# Patient Record
Sex: Male | Born: 1965 | Hispanic: No | Smoking: Former smoker
Health system: Southern US, Community
[De-identification: ages and names within clinical notes are randomized; demographics above are authoritative.]

## PROBLEM LIST (undated history)

## (undated) DIAGNOSIS — M1990 Unspecified osteoarthritis, unspecified site: Secondary | ICD-10-CM

## (undated) DIAGNOSIS — T4145XA Adverse effect of unspecified anesthetic, initial encounter: Secondary | ICD-10-CM

## (undated) DIAGNOSIS — G4730 Sleep apnea, unspecified: Secondary | ICD-10-CM

## (undated) DIAGNOSIS — T8859XA Other complications of anesthesia, initial encounter: Secondary | ICD-10-CM

## (undated) DIAGNOSIS — K219 Gastro-esophageal reflux disease without esophagitis: Secondary | ICD-10-CM

## (undated) DIAGNOSIS — G709 Myoneural disorder, unspecified: Secondary | ICD-10-CM

## (undated) DIAGNOSIS — I10 Essential (primary) hypertension: Secondary | ICD-10-CM

## (undated) DIAGNOSIS — E7800 Pure hypercholesterolemia, unspecified: Secondary | ICD-10-CM

## (undated) DIAGNOSIS — G8929 Other chronic pain: Secondary | ICD-10-CM

## (undated) DIAGNOSIS — I499 Cardiac arrhythmia, unspecified: Secondary | ICD-10-CM

## (undated) HISTORY — DX: Cardiac arrhythmia, unspecified: I49.9

## (undated) HISTORY — DX: Sleep apnea, unspecified: G47.30

## (undated) HISTORY — PX: JOINT REPLACEMENT: SHX530

## (undated) HISTORY — DX: Other chronic pain: G89.29

---

## 2006-11-25 ENCOUNTER — Emergency Department (HOSPITAL_COMMUNITY): Admission: EM | Admit: 2006-11-25 | Discharge: 2006-11-25 | Payer: Self-pay | Admitting: Emergency Medicine

## 2007-11-09 ENCOUNTER — Emergency Department (HOSPITAL_COMMUNITY): Admission: EM | Admit: 2007-11-09 | Discharge: 2007-11-09 | Payer: Self-pay | Admitting: Emergency Medicine

## 2007-12-15 ENCOUNTER — Emergency Department (HOSPITAL_COMMUNITY): Admission: EM | Admit: 2007-12-15 | Discharge: 2007-12-15 | Payer: Self-pay | Admitting: Emergency Medicine

## 2009-04-17 ENCOUNTER — Emergency Department (HOSPITAL_COMMUNITY): Admission: EM | Admit: 2009-04-17 | Discharge: 2009-04-17 | Payer: Self-pay | Admitting: Emergency Medicine

## 2009-08-29 ENCOUNTER — Emergency Department (HOSPITAL_COMMUNITY): Admission: EM | Admit: 2009-08-29 | Discharge: 2009-08-29 | Payer: Self-pay | Admitting: Emergency Medicine

## 2011-02-13 LAB — RAPID STREP SCREEN (MED CTR MEBANE ONLY): Streptococcus, Group A Screen (Direct): POSITIVE — AB

## 2013-03-01 DIAGNOSIS — Z993 Dependence on wheelchair: Secondary | ICD-10-CM | POA: Insufficient documentation

## 2013-04-20 DIAGNOSIS — Z96649 Presence of unspecified artificial hip joint: Secondary | ICD-10-CM | POA: Insufficient documentation

## 2013-05-11 DIAGNOSIS — M7060 Trochanteric bursitis, unspecified hip: Secondary | ICD-10-CM | POA: Insufficient documentation

## 2013-07-13 DIAGNOSIS — M1612 Unilateral primary osteoarthritis, left hip: Secondary | ICD-10-CM | POA: Insufficient documentation

## 2014-04-19 DIAGNOSIS — R9439 Abnormal result of other cardiovascular function study: Secondary | ICD-10-CM | POA: Insufficient documentation

## 2014-05-19 HISTORY — PX: TOTAL HIP ARTHROPLASTY: SHX124

## 2014-05-19 HISTORY — PX: APPENDECTOMY: SHX54

## 2014-05-20 DIAGNOSIS — Z9049 Acquired absence of other specified parts of digestive tract: Secondary | ICD-10-CM | POA: Insufficient documentation

## 2014-05-21 DIAGNOSIS — I10 Essential (primary) hypertension: Secondary | ICD-10-CM | POA: Insufficient documentation

## 2016-03-11 ENCOUNTER — Emergency Department (HOSPITAL_COMMUNITY)
Admission: EM | Admit: 2016-03-11 | Discharge: 2016-03-11 | Disposition: A | Discharge status: Home / Self Care | Payer: Medicaid Other | Attending: Emergency Medicine | Admitting: Emergency Medicine

## 2016-03-11 ENCOUNTER — Encounter (HOSPITAL_COMMUNITY): Payer: Self-pay

## 2016-03-11 DIAGNOSIS — S39012A Strain of muscle, fascia and tendon of lower back, initial encounter: Secondary | ICD-10-CM | POA: Insufficient documentation

## 2016-03-11 DIAGNOSIS — Y939 Activity, unspecified: Secondary | ICD-10-CM | POA: Diagnosis not present

## 2016-03-11 DIAGNOSIS — Y92410 Unspecified street and highway as the place of occurrence of the external cause: Secondary | ICD-10-CM | POA: Insufficient documentation

## 2016-03-11 DIAGNOSIS — M62838 Other muscle spasm: Secondary | ICD-10-CM | POA: Insufficient documentation

## 2016-03-11 DIAGNOSIS — F17210 Nicotine dependence, cigarettes, uncomplicated: Secondary | ICD-10-CM | POA: Insufficient documentation

## 2016-03-11 DIAGNOSIS — Y999 Unspecified external cause status: Secondary | ICD-10-CM | POA: Insufficient documentation

## 2016-03-11 DIAGNOSIS — S3992XA Unspecified injury of lower back, initial encounter: Secondary | ICD-10-CM | POA: Diagnosis present
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
## 2016-03-11 NOTE — ED Notes (Signed)
**Note De-identified [place] Obfuscation** Bed: WA12 Expected date:  Expected time:  Means of arrival:  Comments: EMS-N/V 

## 2016-09-11 ENCOUNTER — Encounter (HOSPITAL_COMMUNITY): Payer: Self-pay

## 2016-09-11 ENCOUNTER — Emergency Department (HOSPITAL_COMMUNITY)
Admission: EM | Admit: 2016-09-11 | Discharge: 2016-09-11 | Disposition: A | Discharge status: Home / Self Care | Payer: Medicaid Other | Attending: Emergency Medicine | Admitting: Emergency Medicine

## 2016-09-11 DIAGNOSIS — Y929 Unspecified place or not applicable: Secondary | ICD-10-CM | POA: Diagnosis not present

## 2016-09-11 DIAGNOSIS — Y999 Unspecified external cause status: Secondary | ICD-10-CM | POA: Diagnosis not present

## 2016-09-11 DIAGNOSIS — Y939 Activity, unspecified: Secondary | ICD-10-CM | POA: Insufficient documentation

## 2016-09-11 DIAGNOSIS — Z96643 Presence of artificial hip joint, bilateral: Secondary | ICD-10-CM | POA: Diagnosis not present

## 2016-09-11 DIAGNOSIS — I10 Essential (primary) hypertension: Secondary | ICD-10-CM | POA: Insufficient documentation

## 2016-09-11 DIAGNOSIS — R202 Paresthesia of skin: Secondary | ICD-10-CM

## 2016-09-11 DIAGNOSIS — S161XXA Strain of muscle, fascia and tendon at neck level, initial encounter: Secondary | ICD-10-CM

## 2016-09-11 DIAGNOSIS — F17210 Nicotine dependence, cigarettes, uncomplicated: Secondary | ICD-10-CM | POA: Diagnosis not present

## 2016-09-11 DIAGNOSIS — X58XXXA Exposure to other specified factors, initial encounter: Secondary | ICD-10-CM | POA: Insufficient documentation

## 2016-09-11 DIAGNOSIS — S199XXA Unspecified injury of neck, initial encounter: Secondary | ICD-10-CM | POA: Diagnosis present

## 2016-09-11 HISTORY — DX: Essential (primary) hypertension: I10

## 2016-09-11 MED ORDER — NAPROXEN 500 MG PO TABS: 500.0000 mg | ORAL_TABLET | Freq: Two times a day (BID) | ORAL | 0 refills | Status: AC

## 2016-09-11 NOTE — ED Triage Notes (Signed)
**Note De-Identified Mulroy Obfuscation** Patient complains of 3 weeks of neck pain with any ROM. States that the pain is now radiating down back and having some bilateral hand numbness. Denies trauma. Alert and oriented, NAD

## 2016-09-11 NOTE — ED Provider Notes (Signed)
**Note De-Identified Jerrett Obfuscation** [facility]-EMERGENCY DEPT Provider Note   CSN: [id number] Arrival date & time: 09/11/16  1235   By signing my name below, I, Avnee Patel, attest that this documentation has been prepared under the direction and in the presence of Lyndal Pulley, MD  Electronically Signed: Clovis Pu, ED Scribe. 09/11/16. 1:12 PM.   History   Chief Complaint Chief Complaint  Patient presents with  . Neck Pain    HPI Comments:  Anjelo Pyka is a 51 y.o. male who presents to the Emergency Department complaining of recurrent, intermittent neck pain x 3 weeks. Pt reports new paraesthesias to his bilateral upper extremities. He has tried icy/hot patches with no significant relief. No other modifying factors noted. Pt denies weakness or any other associated symptoms. No other complaints noted at this time.   The history is provided by the patient. No language interpreter was used.    Past Medical History:  Diagnosis Date  . Hypertension     There are no active problems to display for this patient.   Past Surgical History:  Procedure Laterality Date  . JOINT REPLACEMENT    . TOTAL HIP ARTHROPLASTY Bilateral     Home Medications    Prior to Admission medications   Not on File    Family History No family history on file.  Social History Social History  Substance Use Topics  . Smoking status: Current Every Day Smoker    Packs/day: 0.50    Types: Cigarettes  . Smokeless tobacco: Never Used  . Alcohol use No     Allergies   Patient has no known allergies.   Review of Systems Review of Systems  Musculoskeletal: Positive for neck pain.  Neurological: Negative for weakness.  All other systems reviewed and are negative.    Physical Exam Updated Vital Signs BP (!) 190/90   Pulse 90   Temp 97.6 F (36.4 C) (Oral)   Resp 18   SpO2 97%   Physical Exam  Constitutional: He is oriented to person, place, and time. He appears well-developed and well-nourished. No distress.  HENT:    Head: Normocephalic and atraumatic.  Nose: Nose normal.  Eyes: Conjunctivae are normal.  Neck: Neck supple. No tracheal deviation present.  Cardiovascular: Normal rate and regular rhythm.   Pulmonary/Chest: Effort normal. No respiratory distress.  Abdominal: Soft. He exhibits no distension.  Musculoskeletal:  b/l cervical paraspinal tenderness without midline tenderness  Neurological: He is alert and oriented to person, place, and time.  Full strength, sensation and movement of bilateral upper extremities.   Skin: Skin is warm and dry.  Psychiatric: He has a normal mood and affect.     ED Treatments / Results  DIAGNOSTIC STUDIES:  Oxygen Saturation is 97% on RA, normal by my interpretation.    COORDINATION OF CARE:  1:11 PM Discussed treatment plan with pt at bedside and pt agreed to plan.  Labs (all labs ordered are listed, but only abnormal results are displayed) Labs Reviewed - No data to display  EKG  EKG Interpretation None       Radiology No results found.  Procedures Procedures (including critical care time)  Medications Ordered in ED Medications - No data to display   Initial Impression / Assessment and Plan / ED Course  I have reviewed the triage vital signs and the nursing notes.  Pertinent labs & imaging results that were available during my care of the patient were reviewed by me and considered in my medical decision making (see chart for **Note De-Identified Seubert Obfuscation** details).     51 year old male presents with bilateral neck pain that is been progressing over weeks. He has noticed some tingling in his hands. He has no motor deficits or signs of discopathy currently. I suspect that he has some bilateral neck muscle spasm that has produced his symptoms. He was offered a local trigger point injection and declined. Patient was recommended to take short course of scheduled NSAIDs and engage in early mobility as definitive treatment. Plan to follow up with PCP as needed and return  precautions discussed for worsening or new concerning symptoms.   Final Clinical Impressions(s) / ED Diagnoses   Final diagnoses:  Strain of neck muscle, initial encounter  Paresthesia of both hands    New Prescriptions New Prescriptions   NAPROXEN (NAPROSYN) 500 MG TABLET    Take 1 tablet (500 mg total) by mouth 2 (two) times daily with a meal.   I personally performed the services described in this documentation, which was scribed in my presence. The recorded information has been reviewed and is accurate.       Lyndal Pulley, MD 09/11/16 1324

## 2016-09-11 NOTE — ED Notes (Signed)
**Note De-Identified Ellender Obfuscation** Pt states that he has not taken BP med this am -- will take when he is d/c'd.  Pt c/o neck pain that radiates from base of skull down back. Has been getting worse for 3 weeks-- has tried Ice, heat, and oTC meds-- no relief.

## 2016-09-17 ENCOUNTER — Emergency Department (HOSPITAL_COMMUNITY)
Admission: EM | Admit: 2016-09-17 | Discharge: 2016-09-17 | Disposition: A | Discharge status: Home / Self Care | Payer: Medicaid Other | Attending: Emergency Medicine | Admitting: Emergency Medicine

## 2016-09-17 ENCOUNTER — Encounter (HOSPITAL_COMMUNITY): Payer: Self-pay

## 2016-09-17 ENCOUNTER — Emergency Department (HOSPITAL_COMMUNITY): Payer: Medicaid Other

## 2016-09-17 DIAGNOSIS — Z96643 Presence of artificial hip joint, bilateral: Secondary | ICD-10-CM | POA: Diagnosis not present

## 2016-09-17 DIAGNOSIS — Z79899 Other long term (current) drug therapy: Secondary | ICD-10-CM | POA: Insufficient documentation

## 2016-09-17 DIAGNOSIS — Y999 Unspecified external cause status: Secondary | ICD-10-CM | POA: Diagnosis not present

## 2016-09-17 DIAGNOSIS — Z7982 Long term (current) use of aspirin: Secondary | ICD-10-CM | POA: Insufficient documentation

## 2016-09-17 DIAGNOSIS — F17210 Nicotine dependence, cigarettes, uncomplicated: Secondary | ICD-10-CM | POA: Insufficient documentation

## 2016-09-17 DIAGNOSIS — Y939 Activity, unspecified: Secondary | ICD-10-CM | POA: Diagnosis not present

## 2016-09-17 DIAGNOSIS — Y929 Unspecified place or not applicable: Secondary | ICD-10-CM | POA: Diagnosis not present

## 2016-09-17 DIAGNOSIS — W1830XA Fall on same level, unspecified, initial encounter: Secondary | ICD-10-CM | POA: Diagnosis not present

## 2016-09-17 DIAGNOSIS — M4802 Spinal stenosis, cervical region: Secondary | ICD-10-CM | POA: Diagnosis not present

## 2016-09-17 DIAGNOSIS — R200 Anesthesia of skin: Secondary | ICD-10-CM | POA: Diagnosis present

## 2016-09-17 DIAGNOSIS — I10 Essential (primary) hypertension: Secondary | ICD-10-CM | POA: Insufficient documentation

## 2016-09-17 HISTORY — DX: Pure hypercholesterolemia, unspecified: E78.00

## 2016-09-17 LAB — CBC WITH DIFFERENTIAL/PLATELET
BASOS ABS: 0.1 10*3/uL (ref 0.0–0.1)
BASOS PCT: 1 %
EOS ABS: 0.2 10*3/uL (ref 0.0–0.7)
EOS PCT: 2 %
HEMATOCRIT: 42.8 % (ref 39.0–52.0)
Hemoglobin: 14.4 g/dL (ref 13.0–17.0)
Lymphocytes Relative: 38 %
Lymphs Abs: 3.4 10*3/uL (ref 0.7–4.0)
MCH: 29 pg (ref 26.0–34.0)
MCHC: 33.6 g/dL (ref 30.0–36.0)
MCV: 86.1 fL (ref 78.0–100.0)
MONO ABS: 0.7 10*3/uL (ref 0.1–1.0)
MONOS PCT: 8 %
Neutro Abs: 4.5 10*3/uL (ref 1.7–7.7)
Neutrophils Relative %: 51 %
PLATELETS: 204 10*3/uL (ref 150–400)
RBC: 4.97 MIL/uL (ref 4.22–5.81)
RDW: 14.4 % (ref 11.5–15.5)
WBC: 8.8 10*3/uL (ref 4.0–10.5)

## 2016-09-17 LAB — URINALYSIS, ROUTINE W REFLEX MICROSCOPIC
BILIRUBIN URINE: NEGATIVE
Glucose, UA: NEGATIVE mg/dL
Hgb urine dipstick: NEGATIVE
KETONES UR: NEGATIVE mg/dL
LEUKOCYTES UA: NEGATIVE
NITRITE: NEGATIVE
PH: 5 (ref 5.0–8.0)
PROTEIN: NEGATIVE mg/dL
Specific Gravity, Urine: 1.021 (ref 1.005–1.030)

## 2016-09-17 LAB — COMPREHENSIVE METABOLIC PANEL
ALBUMIN: 4.4 g/dL (ref 3.5–5.0)
ALK PHOS: 87 U/L (ref 38–126)
ALT: 37 U/L (ref 17–63)
ANION GAP: 8 (ref 5–15)
AST: 30 U/L (ref 15–41)
BILIRUBIN TOTAL: 0.4 mg/dL (ref 0.3–1.2)
BUN: 11 mg/dL (ref 6–20)
CALCIUM: 9 mg/dL (ref 8.9–10.3)
CO2: 24 mmol/L (ref 22–32)
CREATININE: 0.85 mg/dL (ref 0.61–1.24)
Chloride: 107 mmol/L (ref 101–111)
GFR calc Af Amer: >60 mL/min (ref >60)
GFR calc non Af Amer: >60 mL/min (ref >60)
GLUCOSE: 117 mg/dL — AB (ref 65–99)
Potassium: 3.7 mmol/L (ref 3.5–5.1)
SODIUM: 139 mmol/L (ref 135–145)
TOTAL PROTEIN: 7.8 g/dL (ref 6.5–8.1)

## 2016-09-17 LAB — MAGNESIUM: Magnesium: 2.1 mg/dL (ref 1.7–2.4)

## 2016-09-17 MED ORDER — METHYLPREDNISOLONE 4 MG PO TBPK: ORAL_TABLET | ORAL | 0 refills | Status: DC

## 2016-09-17 NOTE — ED Provider Notes (Signed)
**Note De-Identified Reap Obfuscation** [facility] Provider Note   CSN: [id number] Arrival date & time: 09/17/16  1025     History   Chief Complaint Chief Complaint  Patient presents with  . Fall  . Numbness    HPI Hunter Hernandez is a 51 y.o. male.  The history is provided by the patient. No language interpreter was used.  Fall    Hunter Hernandez is a 51 y.o. male who presents to the Emergency Department complaining of numbness.  He presents for evaluation of progressive numbness. Over the last 3-4 weeks he has had progressive numbness and tingling sensation in bilateral arms and legs involving his trunk as well. He initially had associated neck pain and was seen in the emergency department and he was started on naproxen. Since taking the naproxen his neck pain has resolved but he has ongoing paresthesias throughout his body. Only his head is spared from these paresthesias. He denies any chest pain, shortness of breath, nausea, vomiting, abdominal pain, fever, weakness. He is falling more frequently secondary to feeling numb. Past Medical History:  Diagnosis Date  . High cholesterol   . Hypertension     There are no active problems to display for this patient.   Past Surgical History:  Procedure Laterality Date  . APPENDECTOMY    . JOINT REPLACEMENT    . TOTAL HIP ARTHROPLASTY Bilateral        Home Medications    Prior to Admission medications   Medication Sig Start Date End Date Taking? Authorizing Provider  amLODipine (NORVASC) 10 MG tablet Take 10 mg by mouth daily.   Yes Historical Provider, MD  aspirin-acetaminophen-caffeine (EXCEDRIN MIGRAINE) [phone] MG tablet Take 2 tablets by mouth every 6 (six) hours as needed (for pain).   Yes Historical Provider, MD  hydrochlorothiazide (HYDRODIURIL) 25 MG tablet Take 25 mg by mouth daily.   Yes Historical Provider, MD  metoprolol succinate (TOPROL-XL) 25 MG 24 hr tablet Take 25 mg by mouth daily.   Yes Historical Provider, MD  naproxen (NAPROSYN) 500 MG  tablet Take 1 tablet (500 mg total) by mouth 2 (two) times daily with a meal. 09/11/16 09/18/16 Yes Lyndal Pulley, MD  pravastatin (PRAVACHOL) 40 MG tablet Take 40 mg by mouth at bedtime.   Yes Historical Provider, MD  methylPREDNISolone (MEDROL DOSEPAK) 4 MG TBPK tablet Take according to package instructions 09/17/16   Tilden Fossa, MD    Family History Family History  Problem Relation Age of Onset  . Cirrhosis Mother   . Cirrhosis Father     Social History Social History  Substance Use Topics  . Smoking status: Current Every Day Smoker    Packs/day: 0.50    Types: Cigarettes  . Smokeless tobacco: Never Used  . Alcohol use No     Allergies   Patient has no known allergies.   Review of Systems Review of Systems  All other systems reviewed and are negative.    Physical Exam Updated Vital Signs BP (!) 176/103 (BP Location: Left Arm)   Pulse 71   Temp 98.1 F (36.7 C) (Oral)   Resp 14   Ht  (1.651 m)   Wt 200 lb (90.7 kg)   SpO2 95%   BMI 33.28 kg/m   Physical Exam  Constitutional: He is oriented to person, place, and time. He appears well-developed and well-nourished.  HENT:  Head: Normocephalic and atraumatic.  Cardiovascular: Normal rate and regular rhythm.   No murmur heard. Pulmonary/Chest: Effort normal and breath sounds normal. No **Note De-Identified Golaszewski Obfuscation** respiratory distress.  Abdominal: Soft. There is no tenderness. There is no rebound and no guarding.  Musculoskeletal: He exhibits no edema or tenderness.  Neurological: He is alert and oriented to person, place, and time.  5 out of 5 strength in all 4 extremities. Altered sensation to light touch throughout bilateral upper, bilateral lower extremities. Decreased sensation to the torso as well as the saddle region. Downgoing toes bilaterally. No ankle clonus. 2+ patellar reflexes bilaterally.  Skin: Skin is warm and dry.  Psychiatric: He has a normal mood and affect. His behavior is normal.  Nursing note and vitals  reviewed.    ED Treatments / Results  Labs (all labs ordered are listed, but only abnormal results are displayed) Labs Reviewed  COMPREHENSIVE METABOLIC PANEL - Abnormal; Notable for the following:       Result Value   Glucose, Bld 117 (*)    All other components within normal limits  CBC WITH DIFFERENTIAL/PLATELET  URINALYSIS, ROUTINE W REFLEX MICROSCOPIC  MAGNESIUM    EKG  EKG Interpretation None       Radiology Mr Cervical Spine Wo Contrast  Result Date: 09/17/2016 CLINICAL DATA:  Patient reports that he had pain of the neck 3 weeks ago and was given Naproxen. Patient states he is now having tingling of bilateral arms and numbness and tingling of both legs. Patient c/o difficulty walking due to the numbness. EXAM: MRI CERVICAL SPINE WITHOUT CONTRAST TECHNIQUE: Multiplanar, multisequence MR imaging of the cervical spine was performed. No intravenous contrast was administered. COMPARISON:  None. FINDINGS: Alignment: Physiologic. Vertebrae: No fracture, evidence of discitis, or bone lesion. Cord: Mild increased T2 signal within the cervical spinal cord at the level C4-5 likely reflecting mild cord edema versus myelomalacia. Posterior Fossa, vertebral arteries, paraspinal tissues: Negative. Disc levels: Discs: Degenerative disc disease with disc height loss at C3-4, C4-5 and C5-6. C2-3: No significant disc bulge. No neural foraminal stenosis. No central canal stenosis. C3-4: Broad-based disc bulge with left uncovertebral degenerative changes. Mild left foraminal stenosis. No right foraminal stenosis. No central canal stenosis. C4-5: Broad-based disc bulge with a left paracentral disc protrusion. Severe spinal stenosis and compression of the cervical spinal cord. Left uncovertebral degenerative changes with severe left foraminal stenosis. Mild right foraminal stenosis. C5-6: Broad-based disc bulge with a broad central disc protrusion contacting the ventral cervical spinal cord. Moderate  spinal stenosis. Left uncovertebral degenerative changes with severe left foraminal stenosis. Moderate right foraminal stenosis. C6-7: Broad-based disc bulge with a small central disc protrusion. Moderate bilateral foraminal stenosis. C7-T1: No significant disc bulge. No neural foraminal stenosis. No central canal stenosis. IMPRESSION: 1. At C4-5 there is a broad-based disc bulge with a left paracentral disc protrusion. Severe spinal stenosis and compression of the cervical spinal cord. Left uncovertebral degenerative changes with severe left foraminal stenosis. Mild right foraminal stenosis. Mild increased T2 signal within the cervical spinal cord at the level C4-5 likely reflecting mild cord edema versus myelomalacia. 2. At C5-6 there is a broad-based disc bulge with a broad central disc protrusion contacting the ventral cervical spinal cord. Moderate spinal stenosis. Left uncovertebral degenerative changes with severe left foraminal stenosis. Moderate right foraminal stenosis. 3. At C3-4 there is a broad-based disc bulge with left uncovertebral degenerative changes. Mild left foraminal stenosis. Electronically Signed   By: Elige Ko   On: 09/17/2016 11:59    Procedures Procedures (including critical care time)  Medications Ordered in ED Medications - No data to display   Initial Impression / Assessment **Note De-Identified Shaheed Obfuscation** and Plan / ED Course  I have reviewed the triage vital signs and the nursing notes.  Pertinent labs & imaging results that were available during my care of the patient were reviewed by me and considered in my medical decision making (see chart for details).     Patient here for 3-4 weeks of progressive tingling in all 4 extremities with frequent falls. He has decreased sensation to light touch from the neck down but otherwise is neurologically intact. MRI demonstrates spinal stenosis. Discussed with Dr. Wynetta Emery with neurosurgery. Will start steroid burst with close neurosurgery follow-up. Discussed  with patient close return precautions for any new neurologic complaints.  Final Clinical Impressions(s) / ED Diagnoses   Final diagnoses:  Spinal stenosis of cervical region    New Prescriptions Discharge Medication List as of 09/17/2016 12:53 PM    START taking these medications   Details  methylPREDNISolone (MEDROL DOSEPAK) 4 MG TBPK tablet Take according to package instructions, Print         Tilden Fossa, MD 09/17/16 [phone]

## 2016-09-17 NOTE — ED Notes (Signed)
**Note De-identified Eatherly Obfuscation** Pt given urinal and made aware of need for urine 

## 2016-09-17 NOTE — ED Triage Notes (Signed)
**Note De-Identified Hunter Hernandez** Patient reports that he had pain of the neck 3 weeks ago and was given Naproxen. Patient states he is now having tingling of bilateral arms and numbness and tingling of both legs. Patient c/o difficulty walking due to the numbness.

## 2016-09-17 NOTE — ED Notes (Signed)
**Note De-Identified Studer Obfuscation** Pt wheeled out with walker to lobby.  Home health aide to pick up patient.  Pt independent at discharge.  Verbalized understanding of discharge instructions.

## 2016-09-17 NOTE — ED Notes (Signed)
**Note De-identified Walko Obfuscation** Patient transported to MRI 

## 2016-09-19 ENCOUNTER — Encounter (HOSPITAL_COMMUNITY)
Admission: EM | Disposition: A | Discharge status: Home / Self Care | Payer: Self-pay | Source: Home / Self Care | Attending: Neurosurgery

## 2016-09-19 ENCOUNTER — Inpatient Hospital Stay (HOSPITAL_COMMUNITY)
Admission: EM | Admit: 2016-09-19 | Discharge: 2016-09-21 | DRG: 472 | Disposition: A | Discharge status: Home / Self Care | Payer: Medicaid Other | Attending: Neurosurgery | Admitting: Neurosurgery

## 2016-09-19 ENCOUNTER — Encounter (HOSPITAL_COMMUNITY): Payer: Self-pay | Admitting: Emergency Medicine

## 2016-09-19 ENCOUNTER — Emergency Department (HOSPITAL_COMMUNITY): Payer: Medicaid Other | Admitting: Certified Registered Nurse Anesthetist

## 2016-09-19 ENCOUNTER — Emergency Department (HOSPITAL_COMMUNITY): Payer: Medicaid Other

## 2016-09-19 DIAGNOSIS — M50021 Cervical disc disorder at C4-C5 level with myelopathy: Secondary | ICD-10-CM | POA: Diagnosis present

## 2016-09-19 DIAGNOSIS — G959 Disease of spinal cord, unspecified: Secondary | ICD-10-CM | POA: Diagnosis present

## 2016-09-19 DIAGNOSIS — F17210 Nicotine dependence, cigarettes, uncomplicated: Secondary | ICD-10-CM | POA: Diagnosis present

## 2016-09-19 DIAGNOSIS — R296 Repeated falls: Secondary | ICD-10-CM | POA: Diagnosis present

## 2016-09-19 DIAGNOSIS — M4712 Other spondylosis with myelopathy, cervical region: Secondary | ICD-10-CM | POA: Diagnosis present

## 2016-09-19 DIAGNOSIS — M4802 Spinal stenosis, cervical region: Secondary | ICD-10-CM | POA: Diagnosis present

## 2016-09-19 DIAGNOSIS — I10 Essential (primary) hypertension: Secondary | ICD-10-CM | POA: Diagnosis present

## 2016-09-19 DIAGNOSIS — G9520 Unspecified cord compression: Secondary | ICD-10-CM | POA: Diagnosis present

## 2016-09-19 DIAGNOSIS — Z96643 Presence of artificial hip joint, bilateral: Secondary | ICD-10-CM | POA: Diagnosis present

## 2016-09-19 DIAGNOSIS — Z419 Encounter for procedure for purposes other than remedying health state, unspecified: Secondary | ICD-10-CM

## 2016-09-19 HISTORY — PX: ANTERIOR CERVICAL DECOMP/DISCECTOMY FUSION: SHX1161

## 2016-09-19 LAB — COMPREHENSIVE METABOLIC PANEL
ALBUMIN: 4.2 g/dL (ref 3.5–5.0)
ALT: 36 U/L (ref 17–63)
AST: 36 U/L (ref 15–41)
Alkaline Phosphatase: 86 U/L (ref 38–126)
Anion gap: 9 (ref 5–15)
BUN: 14 mg/dL (ref 6–20)
CHLORIDE: 106 mmol/L (ref 101–111)
CO2: 25 mmol/L (ref 22–32)
CREATININE: 0.86 mg/dL (ref 0.61–1.24)
Calcium: 9.2 mg/dL (ref 8.9–10.3)
GFR calc Af Amer: >60 mL/min (ref >60)
GLUCOSE: 104 mg/dL — AB (ref 65–99)
POTASSIUM: 3.4 mmol/L — AB (ref 3.5–5.1)
Sodium: 140 mmol/L (ref 135–145)
Total Bilirubin: 1.1 mg/dL (ref 0.3–1.2)
Total Protein: 7.9 g/dL (ref 6.5–8.1)

## 2016-09-19 LAB — CBC WITH DIFFERENTIAL/PLATELET
Basophils Absolute: 0 10*3/uL (ref 0.0–0.1)
Basophils Relative: 0 %
EOS ABS: 0.1 10*3/uL (ref 0.0–0.7)
EOS PCT: 1 %
HCT: 44.8 % (ref 39.0–52.0)
Hemoglobin: 14.5 g/dL (ref 13.0–17.0)
LYMPHS ABS: 4.6 10*3/uL — AB (ref 0.7–4.0)
LYMPHS PCT: 34 %
MCH: 28.5 pg (ref 26.0–34.0)
MCHC: 32.4 g/dL (ref 30.0–36.0)
MCV: 88.2 fL (ref 78.0–100.0)
MONO ABS: 1 10*3/uL (ref 0.1–1.0)
MONOS PCT: 8 %
Neutro Abs: 7.8 10*3/uL — ABNORMAL HIGH (ref 1.7–7.7)
Neutrophils Relative %: 57 %
PLATELETS: 204 10*3/uL (ref 150–400)
RBC: 5.08 MIL/uL (ref 4.22–5.81)
RDW: 14.3 % (ref 11.5–15.5)
WBC: 13.6 10*3/uL — AB (ref 4.0–10.5)

## 2016-09-19 LAB — URINALYSIS, ROUTINE W REFLEX MICROSCOPIC
BILIRUBIN URINE: NEGATIVE
GLUCOSE, UA: NEGATIVE mg/dL
Hgb urine dipstick: NEGATIVE
KETONES UR: NEGATIVE mg/dL
Leukocytes, UA: NEGATIVE
NITRITE: NEGATIVE
PH: 5 (ref 5.0–8.0)
Protein, ur: NEGATIVE mg/dL
SPECIFIC GRAVITY, URINE: 1.017 (ref 1.005–1.030)

## 2016-09-19 LAB — SURGICAL PCR SCREEN
MRSA, PCR: NEGATIVE
STAPHYLOCOCCUS AUREUS: POSITIVE — AB

## 2016-09-19 LAB — PROTIME-INR
INR: 1.04
PROTHROMBIN TIME: 13.6 s (ref 11.4–15.2)

## 2016-09-19 SURGERY — ANTERIOR CERVICAL DECOMPRESSION/DISCECTOMY FUSION 1 LEVEL
Anesthesia: General

## 2016-09-19 MED ORDER — METHYLPREDNISOLONE SODIUM SUCC 125 MG IJ SOLR
125.0000 mg | Freq: Once | INTRAMUSCULAR | Status: AC
  Administered 2016-09-19: 125 mg via INTRAVENOUS
  Filled 2016-09-19: qty 2

## 2016-09-19 MED ORDER — FENTANYL CITRATE (PF) 100 MCG/2ML IJ SOLN
INTRAMUSCULAR | Status: DC | PRN
  Administered 2016-09-19: 100 ug via INTRAVENOUS
  Administered 2016-09-19: 50 ug via INTRAVENOUS
  Administered 2016-09-19: 100 ug via INTRAVENOUS
  Administered 2016-09-19: 50 ug via INTRAVENOUS

## 2016-09-19 MED ORDER — HYDROMORPHONE HCL 1 MG/ML IJ SOLN
1.0000 mg | Freq: Once | INTRAMUSCULAR | Status: AC
  Administered 2016-09-19: 1 mg via INTRAVENOUS
  Filled 2016-09-19: qty 1

## 2016-09-19 MED ORDER — PHENYLEPHRINE HCL 10 MG/ML IJ SOLN
INTRAMUSCULAR | Status: DC | PRN
  Administered 2016-09-19: 10 ug/min via INTRAVENOUS

## 2016-09-19 MED ORDER — METOPROLOL TARTRATE 5 MG/5ML IV SOLN
INTRAVENOUS | Status: DC | PRN
  Administered 2016-09-19 (×2): 1 mg via INTRAVENOUS

## 2016-09-19 MED ORDER — HYDROMORPHONE HCL 1 MG/ML IJ SOLN: 0.2500 mg | INTRAMUSCULAR | Status: DC | PRN

## 2016-09-19 MED ORDER — LIDOCAINE HCL (CARDIAC) 20 MG/ML IV SOLN
INTRAVENOUS | Status: DC | PRN
  Administered 2016-09-19: 80 mg via INTRAVENOUS

## 2016-09-19 MED ORDER — PROMETHAZINE HCL 25 MG/ML IJ SOLN: 6.2500 mg | INTRAMUSCULAR | Status: DC | PRN

## 2016-09-19 MED ORDER — SUGAMMADEX SODIUM 200 MG/2ML IV SOLN
INTRAVENOUS | Status: DC | PRN
  Administered 2016-09-19: 200 mg via INTRAVENOUS

## 2016-09-19 MED ORDER — THROMBIN 5000 UNITS EX SOLR
OROMUCOSAL | Status: DC | PRN
  Administered 2016-09-19: 20:00:00 via TOPICAL

## 2016-09-19 MED ORDER — DEXTROSE 5 % IV SOLN
1000.0000 mg | Freq: Once | INTRAVENOUS | Status: AC
  Administered 2016-09-19: 1000 mg via INTRAVENOUS
  Filled 2016-09-19: qty 10

## 2016-09-19 MED ORDER — METHOCARBAMOL 1000 MG/10ML IJ SOLN: 1000.0000 mg | Freq: Once | INTRAMUSCULAR | Status: DC

## 2016-09-19 MED ORDER — CEFAZOLIN SODIUM-DEXTROSE 2-3 GM-% IV SOLR
INTRAVENOUS | Status: DC | PRN
  Administered 2016-09-19: 2 g via INTRAVENOUS

## 2016-09-19 MED ORDER — MIDAZOLAM HCL 2 MG/2ML IJ SOLN
INTRAMUSCULAR | Status: AC
  Filled 2016-09-19: qty 2

## 2016-09-19 MED ORDER — HYDRALAZINE HCL 20 MG/ML IJ SOLN
INTRAMUSCULAR | Status: AC
  Filled 2016-09-19: qty 1

## 2016-09-19 MED ORDER — ROCURONIUM BROMIDE 100 MG/10ML IV SOLN
INTRAVENOUS | Status: DC | PRN
  Administered 2016-09-19: 50 mg via INTRAVENOUS

## 2016-09-19 MED ORDER — LACTATED RINGERS IV SOLN
INTRAVENOUS | Status: DC
  Administered 2016-09-19 (×3): via INTRAVENOUS

## 2016-09-19 MED ORDER — THROMBIN 5000 UNITS EX SOLR
CUTANEOUS | Status: AC
  Filled 2016-09-19: qty 15000

## 2016-09-19 MED ORDER — 0.9 % SODIUM CHLORIDE (POUR BTL) OPTIME
TOPICAL | Status: DC | PRN
  Administered 2016-09-19: 1000 mL

## 2016-09-19 MED ORDER — METOPROLOL SUCCINATE ER 25 MG PO TB24
25.0000 mg | ORAL_TABLET | Freq: Every day | ORAL | Status: DC
  Administered 2016-09-19 – 2016-09-21 (×3): 25 mg via ORAL
  Filled 2016-09-19 (×3): qty 1

## 2016-09-19 MED ORDER — DEXAMETHASONE SODIUM PHOSPHATE 4 MG/ML IJ SOLN
INTRAMUSCULAR | Status: DC | PRN
  Administered 2016-09-19: 10 mg via INTRAVENOUS

## 2016-09-19 MED ORDER — HEMOSTATIC AGENTS (NO CHARGE) OPTIME
TOPICAL | Status: DC | PRN
  Administered 2016-09-19: 1 via TOPICAL

## 2016-09-19 MED ORDER — SODIUM CHLORIDE 0.9 % IR SOLN
Status: DC | PRN
  Administered 2016-09-19: 20:00:00

## 2016-09-19 MED ORDER — MUPIROCIN 2 % EX OINT
1.0000 "application " | TOPICAL_OINTMENT | Freq: Once | CUTANEOUS | Status: AC
  Administered 2016-09-19: 1 via TOPICAL

## 2016-09-19 MED ORDER — FENTANYL CITRATE (PF) 250 MCG/5ML IJ SOLN
INTRAMUSCULAR | Status: AC
  Filled 2016-09-19: qty 5

## 2016-09-19 MED ORDER — ACETAMINOPHEN 10 MG/ML IV SOLN
INTRAVENOUS | Status: DC | PRN
  Administered 2016-09-19: 1000 mg via INTRAVENOUS

## 2016-09-19 MED ORDER — ACETAMINOPHEN 10 MG/ML IV SOLN
INTRAVENOUS | Status: AC
  Filled 2016-09-19: qty 100

## 2016-09-19 MED ORDER — DEXAMETHASONE SODIUM PHOSPHATE 10 MG/ML IJ SOLN
10.0000 mg | Freq: Four times a day (QID) | INTRAMUSCULAR | Status: DC
Start: 2016-09-20 — End: 2016-09-20
  Administered 2016-09-20 (×3): 10 mg via INTRAVENOUS
  Filled 2016-09-19 (×4): qty 1

## 2016-09-19 MED ORDER — MIDAZOLAM HCL 5 MG/5ML IJ SOLN
INTRAMUSCULAR | Status: DC | PRN
  Administered 2016-09-19: 2 mg via INTRAVENOUS

## 2016-09-19 MED ORDER — MUPIROCIN 2 % EX OINT
TOPICAL_OINTMENT | CUTANEOUS | Status: AC
  Administered 2016-09-19: 1 via TOPICAL
  Filled 2016-09-19: qty 22

## 2016-09-19 MED ORDER — CEFAZOLIN SODIUM-DEXTROSE 2-4 GM/100ML-% IV SOLN
INTRAVENOUS | Status: AC
  Filled 2016-09-19: qty 100

## 2016-09-19 MED ORDER — THROMBIN 5000 UNITS EX SOLR
CUTANEOUS | Status: DC | PRN
  Administered 2016-09-19 (×2): 5000 [IU] via TOPICAL

## 2016-09-19 MED ORDER — HYDRALAZINE HCL 20 MG/ML IJ SOLN
10.0000 mg | Freq: Once | INTRAMUSCULAR | Status: AC
Start: 2016-09-19 — End: 2016-09-19
  Administered 2016-09-19: 10 mg via INTRAVENOUS

## 2016-09-19 MED ORDER — PROPOFOL 10 MG/ML IV BOLUS
INTRAVENOUS | Status: DC | PRN
  Administered 2016-09-19: 160 mg via INTRAVENOUS

## 2016-09-19 SURGICAL SUPPLY — 68 items
ADH SKN CLS APL DERMABOND .7 (GAUZE/BANDAGES/DRESSINGS) ×1
APL SKNCLS STERI-STRIP NONHPOA (GAUZE/BANDAGES/DRESSINGS) ×1
BAG DECANTER FOR FLEXI CONT (MISCELLANEOUS) ×3 IMPLANT
BENZOIN TINCTURE PRP APPL 2/3 (GAUZE/BANDAGES/DRESSINGS) ×3 IMPLANT
BIT DRILL SPINE QC 14 (BIT) ×2 IMPLANT
BUR MATCHSTICK NEURO 3.0 LAGG (BURR) ×3 IMPLANT
CANISTER SUCT 3000ML PPV (MISCELLANEOUS) ×3 IMPLANT
CARTRIDGE OIL MAESTRO DRILL (MISCELLANEOUS) ×1 IMPLANT
CLOSURE WOUND 1/2 X4 (GAUZE/BANDAGES/DRESSINGS) ×1
DERMABOND ADVANCED (GAUZE/BANDAGES/DRESSINGS) ×2
DERMABOND ADVANCED .7 DNX12 (GAUZE/BANDAGES/DRESSINGS) IMPLANT
DIFFUSER DRILL AIR PNEUMATIC (MISCELLANEOUS) ×3 IMPLANT
DRAPE C-ARM 42X72 X-RAY (DRAPES) ×6 IMPLANT
DRAPE LAPAROTOMY 100X72 PEDS (DRAPES) ×3 IMPLANT
DRAPE MICROSCOPE LEICA (MISCELLANEOUS) ×3 IMPLANT
DRAPE POUCH INSTRU U-SHP 10X18 (DRAPES) ×3 IMPLANT
DRSG OPSITE POSTOP 4X6 (GAUZE/BANDAGES/DRESSINGS) ×2 IMPLANT
DURAPREP 6ML APPLICATOR 50/CS (WOUND CARE) ×3 IMPLANT
ELECT COATED BLADE 2.86 ST (ELECTRODE) ×3 IMPLANT
ELECT REM PT RETURN 9FT ADLT (ELECTROSURGICAL) ×3
ELECTRODE REM PT RTRN 9FT ADLT (ELECTROSURGICAL) ×1 IMPLANT
GAUZE SPONGE 4X4 12PLY STRL (GAUZE/BANDAGES/DRESSINGS) ×3 IMPLANT
GAUZE SPONGE 4X4 16PLY XRAY LF (GAUZE/BANDAGES/DRESSINGS) IMPLANT
GLOVE BIO SURGEON STRL SZ7 (GLOVE) IMPLANT
GLOVE BIO SURGEON STRL SZ8 (GLOVE) ×5 IMPLANT
GLOVE BIO SURGEON STRL SZ8.5 (GLOVE) ×2 IMPLANT
GLOVE BIOGEL PI IND STRL 7.0 (GLOVE) IMPLANT
GLOVE BIOGEL PI IND STRL 7.5 (GLOVE) IMPLANT
GLOVE BIOGEL PI INDICATOR 7.0 (GLOVE)
GLOVE BIOGEL PI INDICATOR 7.5 (GLOVE) ×2
GLOVE ECLIPSE 7.5 STRL STRAW (GLOVE) ×6 IMPLANT
GLOVE EXAM NITRILE LRG STRL (GLOVE) IMPLANT
GLOVE EXAM NITRILE XL STR (GLOVE) IMPLANT
GLOVE EXAM NITRILE XS STR PU (GLOVE) IMPLANT
GLOVE INDICATOR 8.5 STRL (GLOVE) ×3 IMPLANT
GOWN STRL REUS W/ TWL LRG LVL3 (GOWN DISPOSABLE) ×1 IMPLANT
GOWN STRL REUS W/ TWL XL LVL3 (GOWN DISPOSABLE) ×1 IMPLANT
GOWN STRL REUS W/TWL 2XL LVL3 (GOWN DISPOSABLE) ×3 IMPLANT
GOWN STRL REUS W/TWL LRG LVL3 (GOWN DISPOSABLE)
GOWN STRL REUS W/TWL XL LVL3 (GOWN DISPOSABLE) ×6
HALTER HD/CHIN CERV TRACTION D (MISCELLANEOUS) ×3 IMPLANT
HEMOSTAT POWDER KIT SURGIFOAM (HEMOSTASIS) ×3 IMPLANT
KIT BASIN OR (CUSTOM PROCEDURE TRAY) ×3 IMPLANT
KIT ROOM TURNOVER OR (KITS) ×3 IMPLANT
NDL HYPO 18GX1.5 BLUNT FILL (NEEDLE) ×1 IMPLANT
NDL SPNL 20GX3.5 QUINCKE YW (NEEDLE) ×1 IMPLANT
NEEDLE HYPO 18GX1.5 BLUNT FILL (NEEDLE) ×3 IMPLANT
NEEDLE SPNL 20GX3.5 QUINCKE YW (NEEDLE) ×3 IMPLANT
NS IRRIG 1000ML POUR BTL (IV SOLUTION) ×3 IMPLANT
OIL CARTRIDGE MAESTRO DRILL (MISCELLANEOUS) ×3
PACK LAMINECTOMY NEURO (CUSTOM PROCEDURE TRAY) ×3 IMPLANT
PAD ARMBOARD 7.5X6 YLW CONV (MISCELLANEOUS) ×9 IMPLANT
PIN DISTRACTION 14MM (PIN) ×4 IMPLANT
PLATE ANT CERV XTEND 1 LV 14 (Plate) ×2 IMPLANT
RUBBERBAND STERILE (MISCELLANEOUS) ×6 IMPLANT
SCREW XTD VAR 4.2 SELF TAP (Screw) ×8 IMPLANT
SPACER CERVICAL FRGE 12X14X6-7 (Spacer) ×2 IMPLANT
SPACER CERVICAL FRGE 12X14X7-7 (Spacer) ×2 IMPLANT
SPONGE INTESTINAL PEANUT (DISPOSABLE) ×3 IMPLANT
SPONGE SURGIFOAM ABS GEL SZ50 (HEMOSTASIS) ×3 IMPLANT
STRIP CLOSURE SKIN 1/2X4 (GAUZE/BANDAGES/DRESSINGS) ×2 IMPLANT
SUT VIC AB 3-0 SH 8-18 (SUTURE) ×3 IMPLANT
SUT VICRYL 4-0 PS2 18IN ABS (SUTURE) ×3 IMPLANT
TAPE CLOTH 4X10 WHT NS (GAUZE/BANDAGES/DRESSINGS) IMPLANT
TOWEL GREEN STERILE (TOWEL DISPOSABLE) ×3 IMPLANT
TOWEL GREEN STERILE FF (TOWEL DISPOSABLE) ×2 IMPLANT
TRAP SPECIMEN MUCOUS 40CC (MISCELLANEOUS) ×3 IMPLANT
WATER STERILE IRR 1000ML POUR (IV SOLUTION) ×3 IMPLANT

## 2016-09-19 NOTE — H&P (Signed)
**Note De-Identified Binz Obfuscation** Kanaan Schier is an 51 y.o. male.   Chief Complaint: Numbness arms and legs multiple falls and difficulty ambulating today. HPI: A 29-year-old gentleman with no significant past history except for hypertension who's had 3-4 weeks of what began his numbness in his hands and progressed throughout his upper body and occasionally into his legs. Patient was in the ER couple days ago with this numbness however he had no weakness was Deeann Dowse hasn't had no difficulty with bowel bladder. He was set up with outpatient follow-up and scheduled to see me in my office on Monday however he continued to have falls most recent of which was at 3 AM this morning and after this fall which was a hard when landing on his back and striking his head is not been able to ambulate since and has not been able to void since. Patient denies any significant neck pain.  Past Medical History:  Diagnosis Date  . High cholesterol   . Hypertension     Past Surgical History:  Procedure Laterality Date  . APPENDECTOMY    . JOINT REPLACEMENT    . TOTAL HIP ARTHROPLASTY Bilateral     Family History  Problem Relation Age of Onset  . Cirrhosis Mother   . Cirrhosis Father    Social History:  reports that he has been smoking Cigarettes.  He has been smoking about 0.50 packs per day. He has never used smokeless tobacco. He reports that he does not drink alcohol or use drugs.  Allergies: No Known Allergies   (Not in a hospital admission)  Results for orders placed or performed during the hospital encounter of 09/19/16 (from the past 48 hour(s))  Comprehensive metabolic panel     Status: Abnormal   Collection Time: 09/19/16  3:56 PM  Result Value Ref Range   Sodium 140 135 - 145 mmol/L   Potassium 3.4 (L) 3.5 - 5.1 mmol/L   Chloride 106 101 - 111 mmol/L   CO2 25 22 - 32 mmol/L   Glucose, Bld 104 (H) 65 - 99 mg/dL   BUN 14 6 - 20 mg/dL   Creatinine, Ser 0.86 0.61 - 1.24 mg/dL   Calcium 9.2 8.9 - 10.3 mg/dL   Total  Protein 7.9 6.5 - 8.1 g/dL   Albumin 4.2 3.5 - 5.0 g/dL   AST 36 15 - 41 U/L   ALT 36 17 - 63 U/L   Alkaline Phosphatase 86 38 - 126 U/L   Total Bilirubin 1.1 0.3 - 1.2 mg/dL   GFR calc non Af Amer >60 >60 mL/min   GFR calc Af Amer >60 >60 mL/min    Comment: (NOTE) The eGFR has been calculated using the CKD EPI equation. This calculation has not been validated in all clinical situations. eGFR's persistently <60 mL/min signify possible Chronic Kidney Disease.    Anion gap 9 5 - 15  CBC with Differential     Status: Abnormal   Collection Time: 09/19/16  3:56 PM  Result Value Ref Range   WBC 13.6 (H) 4.0 - 10.5 K/uL   RBC 5.08 4.22 - 5.81 MIL/uL   Hemoglobin 14.5 13.0 - 17.0 g/dL   HCT 44.8 39.0 - 52.0 %   MCV 88.2 78.0 - 100.0 fL   MCH 28.5 26.0 - 34.0 pg   MCHC 32.4 30.0 - 36.0 g/dL   RDW 14.3 11.5 - 15.5 %   Platelets 204 150 - 400 K/uL   Neutrophils Relative % 57 %   Neutro Abs 7.8 ( **Note De-Identified Gragert Obfuscation** H) 1.7 - 7.7 K/uL   Lymphocytes Relative 34 %   Lymphs Abs 4.6 (H) 0.7 - 4.0 K/uL   Monocytes Relative 8 %   Monocytes Absolute 1.0 0.1 - 1.0 K/uL   Eosinophils Relative 1 %   Eosinophils Absolute 0.1 0.0 - 0.7 K/uL   Basophils Relative 0 %   Basophils Absolute 0.0 0.0 - 0.1 K/uL  Protime-INR     Status: None   Collection Time: 09/19/16  3:56 PM  Result Value Ref Range   Prothrombin Time 13.6 11.4 - 15.2 seconds   INR 1.04   Urinalysis, Routine w reflex microscopic     Status: None   Collection Time: 09/19/16  4:45 PM  Result Value Ref Range   Color, Urine YELLOW YELLOW   APPearance CLEAR CLEAR   Specific Gravity, Urine 1.017 1.005 - 1.030   pH 5.0 5.0 - 8.0   Glucose, UA NEGATIVE NEGATIVE mg/dL   Hgb urine dipstick NEGATIVE NEGATIVE   Bilirubin Urine NEGATIVE NEGATIVE   Ketones, ur NEGATIVE NEGATIVE mg/dL   Protein, ur NEGATIVE NEGATIVE mg/dL   Nitrite NEGATIVE NEGATIVE   Leukocytes, UA NEGATIVE NEGATIVE   No results found.  Review of Systems  Musculoskeletal: Positive for  falls, myalgias and neck pain.  Neurological: Positive for tingling, sensory change and focal weakness.    Blood pressure (!) 150/90, pulse 84, temperature 98.7 F (37.1 C), temperature source Oral, resp. rate 20, SpO2 96 %. Physical Exam  Constitutional: He appears well-developed and well-nourished.  Eyes: Pupils are equal, round, and reactive to light.  Neck: Normal range of motion.  GI: Soft. Bowel sounds are normal.  Neurological: GCS eye subscore is 4. GCS verbal subscore is 5. GCS motor subscore is 6.  Patient is awake alert right ME strength is 5 out of 5 deltoid, bicep, tricep, wrist flexion, wrist extension, hand intrinsics. Left upper extremity strength however has weakness at 4-5 in his left tricep and hand intrinsics. Lower extremity strength on the right 5 out of 5 iliopsoas quads and she's gastrocs EHL left is also 5 out of 5 iliopsoas quads gastrocs and EHL positive ankle clonus positive hyperreflexia positive spasticity.  Skin: Skin is warm and dry.     Assessment/Plan 51 year old gentleman with severe cervical spondylitic myelopathy weakness left upper extremity numbness from C5 down difficulty with urinary retention and then non-amatory since his fall 3 AM this morning. MRI scan shows severe spinal cord compression from a large disc herniation at C4-5. And due to patient's progression of conical syndrome and imaging findings and failure conservative treatment I recommended anterior cervical discectomy fusion at that level. I've extensively reviewed the risks and benefits of the operation with the patient as well as perioperative course expectations of outcome and alternatives of surgery and he understands and agrees to proceed forward.  Taleshia Luff P, MD 09/19/2016, 5:40 PM

## 2016-09-19 NOTE — Progress Notes (Signed)
**Note De-Identified Ehrman Obfuscation** Patient's BP 193/99.  Dr. Okey Dupreose notified and orders rec'd to give 10mg  IV hydralazine.  Will continue to monitor.

## 2016-09-19 NOTE — Anesthesia Postprocedure Evaluation (Signed)
**Note De-Identified Jankowski Obfuscation** Anesthesia Post Note  Patient: Hunter MandesQuentin Halbig  Procedure(s) Performed: Procedure(s) (LRB): Cervical four-five anterior cervical decompression fusion with plating (N/A)  Patient location during evaluation: PACU Anesthesia Type: General Level of consciousness: awake and alert Pain management: pain level controlled Vital Signs Assessment: post-procedure vital signs reviewed and stable Respiratory status: spontaneous breathing, nonlabored ventilation, respiratory function stable and patient connected to nasal cannula oxygen Cardiovascular status: blood pressure returned to baseline and stable Postop Assessment: no signs of nausea or vomiting Anesthetic complications: no       Last Vitals:  Vitals:   09/19/16 2130 09/19/16 2143  BP:  (!) 193/99  Pulse: 82   Resp: 13   Temp:      Last Pain:  Vitals:   09/19/16 1117  TempSrc: Oral  PainSc:                  Yaretsi Humphres S

## 2016-09-19 NOTE — ED Triage Notes (Signed)
**Note De-Identified Salvino Obfuscation** Pt was seen recently for back pain and dx with spinal stenosis, pt states having back spasms so bad since being dx and has fallen multiple times states fell this am and hit head on window sill, denies loc and is not on blood thinners, pt cannot walk at this time, pt aaox4

## 2016-09-19 NOTE — Anesthesia Preprocedure Evaluation (Signed)
**Note De-Identified Ticas Obfuscation** Anesthesia Evaluation  Patient identified by MRN, date of birth, ID band Patient awake    Reviewed: Allergy & Precautions, NPO status , Patient's Chart, lab work & pertinent test results  Airway Mallampati: II  TM Distance: >3 FB Neck ROM: Limited    Dental no notable dental hx.    Pulmonary Current Smoker,    Pulmonary exam normal breath sounds clear to auscultation       Cardiovascular hypertension, Normal cardiovascular exam Rhythm:Regular Rate:Normal     Neuro/Psych negative neurological ROS  negative psych ROS   GI/Hepatic negative GI ROS, Neg liver ROS,   Endo/Other  negative endocrine ROS  Renal/GU negative Renal ROS  negative genitourinary   Musculoskeletal negative musculoskeletal ROS (+)   Abdominal   Peds negative pediatric ROS (+)  Hematology negative hematology ROS (+)   Anesthesia Other Findings   Reproductive/Obstetrics negative OB ROS                             Anesthesia Physical Anesthesia Plan  ASA: II  Anesthesia Plan: General   Post-op Pain Management:    Induction: Intravenous  Airway Management Planned: Oral ETT  Additional Equipment:   Intra-op Plan:   Post-operative Plan: Extubation in OR  Informed Consent: I have reviewed the patients History and Physical, chart, labs and discussed the procedure including the risks, benefits and alternatives for the proposed anesthesia with the patient or authorized representative who has indicated his/her understanding and acceptance.   Dental advisory given  Plan Discussed with: CRNA and Surgeon  Anesthesia Plan Comments:         Anesthesia Quick Evaluation

## 2016-09-19 NOTE — Anesthesia Procedure Notes (Signed)
**Note De-Identified Sobel Obfuscation** Procedure Name: Intubation Date/Time: 09/19/2016 6:36 PM Performed by: Wray KearnsFOLEY, Guled Gahan A Pre-anesthesia Checklist: Patient identified, Emergency Drugs available, Suction available and Patient being monitored Patient Re-evaluated:Patient Re-evaluated prior to inductionOxygen Delivery Method: Circle System Utilized Preoxygenation: Pre-oxygenation with 100% oxygen Intubation Type: IV induction Ventilation: Mask ventilation without difficulty and Oral airway inserted - appropriate to patient size Laryngoscope Size: Glidescope and 4 Grade View: Grade I Tube type: Oral Tube size: 8.0 mm Number of attempts: 1 Airway Equipment and Method: Stylet and Oral airway Placement Confirmation: ETT inserted through vocal cords under direct vision,  positive ETCO2 and breath sounds checked- equal and bilateral Secured at: 23 cm Tube secured with: Tape Dental Injury: Teeth and Oropharynx as per pre-operative assessment  Comments: Neck and spine maintained in neutral position for induction/intubation.

## 2016-09-19 NOTE — Transfer of Care (Signed)
**Note De-Identified Zion Obfuscation** Immediate Anesthesia Transfer of Care Note  Patient: Hunter Hernandez  Procedure(s) Performed: Procedure(s): Cervical four-five anterior cervical decompression fusion with plating (N/A)  Patient Location: PACU  Anesthesia Type:General  Level of Consciousness: drowsy and patient cooperative  Airway & Oxygen Therapy: Patient Spontanous Breathing and Patient connected to nasal cannula oxygen  Post-op Assessment: Report given to RN and Post -op Vital signs reviewed and stable  Post vital signs: Reviewed and stable  Last Vitals:  Vitals:   09/19/16 1117 09/19/16 1608  BP: (!) 189/95 (!) 150/90  Pulse: 88 84  Resp: (!) 22 20  Temp: 37.1 C     Last Pain:  Vitals:   09/19/16 1117  TempSrc: Oral  PainSc:          Complications: No apparent anesthesia complications

## 2016-09-19 NOTE — Op Note (Signed)
**Note De-Identified Greth Obfuscation** Operative diagnosis: Cervical spondylitic myelopathy from severe cervical stenosis with spinal cord compression signal change within his cord and spinal cord injury at C4-5  Postoperative diagnosis: Same  Procedure: Anterior cervical discectomy and fusion at C4-5 utilizing allograft wedge and globus extend plating system  Surgeon surgeon: Jillyn HiddenGary Dylann Gallier  Asst.: Tressie StalkerJeffrey Jenkins  Anesthesia: Gen.  EBL: Minimal  History of present illness: Patient is a very pleasant 51 year old gentleman who's had 3-4 weeks of progress worsening numbness tingling his hands and arms that cut acutely worse over the last 12 hours when he had a fall at 3:00 this morning where he hit his back and his head and since then he's been unable to walk and has had urinary retention patient brought him to the was a long ER MRI the obtained 2 days ago shown spinal cord compression at C4-5 with signal changes cord and due to patient's rapid progression of clinical syndrome failure conservative treatment imaging findings we recommended anterior cervical discectomy and fusion. We've extensively reviewed the risks and benefits of the operation with him as well as perioperative course expectations of outcome alternatives of surgery and he understood and agreed to proceed forward.  Operative procedure: Patient brought into the or was induced on general anesthesia positioned supine the neck in slight extension in 5 pounds of halter traction the right 76 prepped and draped in routine sterile fashion preoperative x-ray localized the appropriate level so a curvilinear incision was made just off midline to the anterior border of the sternomastoid and the superficial layer of the platysmas dissected out and divided longitudinally the avascular plane to circumflex and mastoid and strap muscle was developed down to the prevertebral fascia which was dissected away with Kitners. Interoperative x-ray identified the appropriate level at C4-5 and then the  longus close reflected laterally and self-retaining retractor was placed. Disc space was then incised and drilled out with a high-speed drill the posterior annulus and posterior complex. Then under microscopic illumination removal of the posterior annulus allowed indication posterior longitudinal ligament which was removed piecemeal fashion and several large fragments of disc removed from the subligamentous and behind the C4 and C5 vertebral bodies. During this the thecal sac resumed its normal anatomic location was widely decompressed marking laterally both C5 pedicles were identified both C5 nerve roots decompressed flush with pedicle. Atlantis Kitners no further stenosis the endplates were scraped the utilizing distractor pins during the entire discectomy I selected initially a 7 mm allograft however this was too big and had to discard this and repositioned a 6 mm bone plug which fit nicely then I contoured a globus extend plate SCREWS excellent purchase locking mechanisms engaged wounds was irrigated meticulous he states was maintained and the wounds closed in layers with after Vicryl skin is closed running 4 subcuticular Dermabond benzo and Steri-Strips and sterile dressings applied patient recovered in stable condition. At the end the case all needle counts and sponge counts were correct.

## 2016-09-19 NOTE — Progress Notes (Signed)
**Note De-Identified Kibby Obfuscation** Patient with history of HTN.  BP post op in PACU 185/91.  HR 88 NSR.  Scheduled dose of 25mg  PO metropolol succinate given at 2113 per Dr. Okey Dupreose.  Will continue to monitor.

## 2016-09-19 NOTE — ED Provider Notes (Signed)
**Note De-Identified Kardell Obfuscation** [facility]-EMERGENCY DEPT Provider Note   CSN: [id number] Arrival date & time: 09/19/16  1104     History   Chief Complaint Chief Complaint  Patient presents with  . Fall  . Back Pain  . Head Injury    HPI Hunter Hernandez is a 51 y.o. male.  HPI Patient was seen on 5\2 for numbness of his upper extremities and legs and trunk. He reports this started with some neck pain and he thought he had a crick but it never really straighten out and symptoms became progressively worse. He had an MRI done on the second and it showed spinal stenosis and disc compression at C4-C5 with some compression of the cord. Patient reports that his symptoms have worsened and he has fallen multiple times now. His wife reports that he can't walk without her supporting him. The patient reports he also feels like he needs to urinate but he can't. Past Medical History:  Diagnosis Date  . High cholesterol   . Hypertension     There are no active problems to display for this patient.   Past Surgical History:  Procedure Laterality Date  . APPENDECTOMY    . JOINT REPLACEMENT    . TOTAL HIP ARTHROPLASTY Bilateral        Home Medications    Prior to Admission medications   Medication Sig Start Date End Date Taking? Authorizing Provider  amLODipine (NORVASC) 10 MG tablet Take 10 mg by mouth daily.   Yes Historical Provider, MD  aspirin-acetaminophen-caffeine (EXCEDRIN MIGRAINE) [phone] MG tablet Take 2 tablets by mouth every 6 (six) hours as needed (for pain).   Yes Historical Provider, MD  hydrochlorothiazide (HYDRODIURIL) 25 MG tablet Take 25 mg by mouth daily.   Yes Historical Provider, MD  methylPREDNISolone (MEDROL DOSEPAK) 4 MG TBPK tablet Take according to package instructions 09/17/16  Yes Tilden Fossa, MD  metoprolol succinate (TOPROL-XL) 25 MG 24 hr tablet Take 25 mg by mouth daily.   Yes Historical Provider, MD  naproxen (NAPROSYN) 500 MG tablet Take 500 mg by mouth 2 (two) times daily with a meal.    Yes Historical Provider, MD  pravastatin (PRAVACHOL) 40 MG tablet Take 40 mg by mouth at bedtime.   Yes Historical Provider, MD    Family History Family History  Problem Relation Age of Onset  . Cirrhosis Mother   . Cirrhosis Father     Social History Social History  Substance Use Topics  . Smoking status: Current Every Day Smoker    Packs/day: 0.50    Types: Cigarettes  . Smokeless tobacco: Never Used  . Alcohol use No     Allergies   Patient has no known allergies.   Review of Systems Review of Systems 10 Systems reviewed and are negative for acute change except as noted in the HPI.  Physical Exam Updated Vital Signs BP (!) 150/90   Pulse 84   Temp 98.7 F (37.1 C) (Oral)   Resp 20   SpO2 96%   Physical Exam  Constitutional: He is oriented to person, place, and time.  Patient appears to be in significant pain. He is lying on his right side. No respiratory distress. Clear mental status  HENT:  Head: Normocephalic and atraumatic.  Mouth/Throat: Oropharynx is clear and moist.  Eyes: EOM are normal.  Neck: Neck supple.  Cardiovascular: Normal rate, regular rhythm, normal heart sounds and intact distal pulses.   Pulmonary/Chest: Effort normal and breath sounds normal.  Abdominal: Soft. He exhibits no distension. There **Note De-Identified Petion Obfuscation** is no tenderness.  Musculoskeletal:  Patient is keeping his lower extremities in slight flexion and fairly stiff. He developed spasmodic pain with certain movements. He does not have peripheral edema. Distal pulses are 2+ and symmetric.   Neurological: He is alert and oriented to person, place, and time. No cranial nerve deficit.  Patient endorses decreased sensation of the upper extremities to touch. Grip strength is intact. Lower extremities are hyperreflexic and patient has significant pain and weakness with extension against resistance. Rectal exam has decreased tone. Patient reports decreased sensation to touch in the anal region.  Skin: Skin is  warm and dry.  Psychiatric: He has a normal mood and affect.     ED Treatments / Results  Labs (all labs ordered are listed, but only abnormal results are displayed) Labs Reviewed  COMPREHENSIVE METABOLIC PANEL - Abnormal; Notable for the following:       Result Value   Potassium 3.4 (*)    Glucose, Bld 104 (*)    All other components within normal limits  CBC WITH DIFFERENTIAL/PLATELET - Abnormal; Notable for the following:    WBC 13.6 (*)    Neutro Abs 7.8 (*)    Lymphs Abs 4.6 (*)    All other components within normal limits  PROTIME-INR  URINALYSIS, ROUTINE W REFLEX MICROSCOPIC    EKG  EKG Interpretation None       Radiology No results found.  Procedures Procedures (including critical care time)  Medications Ordered in ED Medications  methylPREDNISolone sodium succinate (SOLU-MEDROL) 125 mg/2 mL injection 125 mg (125 mg Intravenous Given 09/19/16 1552)  HYDROmorphone (DILAUDID) injection 1 mg (1 mg Intravenous Given 09/19/16 1554)  methocarbamol (ROBAXIN) 1,000 mg in dextrose 5 % 50 mL IVPB (0 mg Intravenous Stopped 09/19/16 1641)     Initial Impression / Assessment and Plan / ED Course  I have reviewed the triage vital signs and the nursing notes.  Pertinent labs & imaging results that were available during my care of the patient were reviewed by me and considered in my medical decision making (see chart for details).    Consult: Patient's case reviewed with Dr. Wynetta Emerycram [facility] and [facility] treatment  Final Clinical Impressions(s) / ED Diagnoses   Final diagnoses:  Spinal stenosis of cervical region  Cervical spinal cord compression [facility](HCC)  Patient presents with advancement of gait dysfunction, urinary retention and loss of sensation with a previously identified disc herniation and spinal stenosis. Patient is referred to neurosurgery for definitive management.  New Prescriptions New Prescriptions   No medications on file     Arby BarrettePfeiffer,  Maxmilian Trostel, MD 09/21/16 [phone]

## 2016-09-20 MED ORDER — METHYLPREDNISOLONE 4 MG PO TBPK: 8.0000 mg | ORAL_TABLET | Freq: Every evening | ORAL | Status: DC

## 2016-09-20 MED ORDER — SODIUM CHLORIDE 0.9% FLUSH: 3.0000 mL | INTRAVENOUS | Status: DC | PRN

## 2016-09-20 MED ORDER — PRAVASTATIN SODIUM 40 MG PO TABS
40.0000 mg | ORAL_TABLET | Freq: Every day | ORAL | Status: DC
  Administered 2016-09-20: 40 mg via ORAL
  Filled 2016-09-20: qty 1

## 2016-09-20 MED ORDER — ASPIRIN-ACETAMINOPHEN-CAFFEINE 250-250-65 MG PO TABS
2.0000 | ORAL_TABLET | Freq: Four times a day (QID) | ORAL | Status: DC | PRN
  Filled 2016-09-20: qty 2

## 2016-09-20 MED ORDER — PANTOPRAZOLE SODIUM 40 MG PO TBEC
40.0000 mg | DELAYED_RELEASE_TABLET | Freq: Every day | ORAL | Status: DC
  Administered 2016-09-20: 40 mg via ORAL
  Filled 2016-09-20: qty 1

## 2016-09-20 MED ORDER — PANTOPRAZOLE SODIUM 40 MG IV SOLR: 40.0000 mg | Freq: Every day | INTRAVENOUS | Status: DC

## 2016-09-20 MED ORDER — METHYLPREDNISOLONE 4 MG PO TBPK: ORAL_TABLET | Freq: Once | ORAL | Status: DC

## 2016-09-20 MED ORDER — AMLODIPINE BESYLATE 10 MG PO TABS
10.0000 mg | ORAL_TABLET | Freq: Every day | ORAL | Status: DC
  Administered 2016-09-20 – 2016-09-21 (×2): 10 mg via ORAL
  Filled 2016-09-20 (×2): qty 1

## 2016-09-20 MED ORDER — MENTHOL 3 MG MT LOZG: 1.0000 | LOZENGE | OROMUCOSAL | Status: DC | PRN

## 2016-09-20 MED ORDER — HYDROMORPHONE HCL 1 MG/ML IJ SOLN: 0.5000 mg | INTRAMUSCULAR | Status: DC | PRN

## 2016-09-20 MED ORDER — ALUM & MAG HYDROXIDE-SIMETH 200-200-20 MG/5ML PO SUSP: 30.0000 mL | Freq: Four times a day (QID) | ORAL | Status: DC | PRN

## 2016-09-20 MED ORDER — ACETAMINOPHEN 650 MG RE SUPP: 650.0000 mg | RECTAL | Status: DC | PRN

## 2016-09-20 MED ORDER — SODIUM CHLORIDE 0.9 % IV SOLN
250.0000 mL | INTRAVENOUS | Status: DC
  Administered 2016-09-20: 250 mL via INTRAVENOUS

## 2016-09-20 MED ORDER — CYCLOBENZAPRINE HCL 10 MG PO TABS: 10.0000 mg | ORAL_TABLET | Freq: Three times a day (TID) | ORAL | Status: DC | PRN

## 2016-09-20 MED ORDER — ONDANSETRON HCL 4 MG PO TABS: 4.0000 mg | ORAL_TABLET | Freq: Four times a day (QID) | ORAL | Status: DC | PRN

## 2016-09-20 MED ORDER — ACETAMINOPHEN 325 MG PO TABS: 650.0000 mg | ORAL_TABLET | ORAL | Status: DC | PRN

## 2016-09-20 MED ORDER — METHYLPREDNISOLONE 4 MG PO TBPK
8.0000 mg | ORAL_TABLET | Freq: Every morning | ORAL | Status: AC
  Administered 2016-09-20: 8 mg via ORAL
  Filled 2016-09-20: qty 21

## 2016-09-20 MED ORDER — SODIUM CHLORIDE 0.9% FLUSH
3.0000 mL | Freq: Two times a day (BID) | INTRAVENOUS | Status: DC
  Administered 2016-09-20: 3 mL via INTRAVENOUS

## 2016-09-20 MED ORDER — METHYLPREDNISOLONE 4 MG PO TBPK
4.0000 mg | ORAL_TABLET | ORAL | Status: AC
  Administered 2016-09-20: 4 mg via ORAL

## 2016-09-20 MED ORDER — METHYLPREDNISOLONE 4 MG PO TBPK: 4.0000 mg | ORAL_TABLET | ORAL | Status: DC

## 2016-09-20 MED ORDER — HYDROCHLOROTHIAZIDE 25 MG PO TABS
25.0000 mg | ORAL_TABLET | Freq: Every day | ORAL | Status: DC
  Administered 2016-09-20 – 2016-09-21 (×2): 25 mg via ORAL
  Filled 2016-09-20 (×2): qty 1

## 2016-09-20 MED ORDER — ONDANSETRON HCL 4 MG/2ML IJ SOLN: 4.0000 mg | Freq: Four times a day (QID) | INTRAMUSCULAR | Status: DC | PRN

## 2016-09-20 MED ORDER — PHENOL 1.4 % MT LIQD
1.0000 | OROMUCOSAL | Status: DC | PRN
  Administered 2016-09-20: 1 via OROMUCOSAL
  Filled 2016-09-20: qty 177

## 2016-09-20 MED ORDER — METHYLPREDNISOLONE 4 MG PO TBPK: 4.0000 mg | ORAL_TABLET | Freq: Four times a day (QID) | ORAL | Status: DC

## 2016-09-20 MED ORDER — NAPROXEN 250 MG PO TABS
500.0000 mg | ORAL_TABLET | Freq: Two times a day (BID) | ORAL | Status: DC
  Administered 2016-09-20 – 2016-09-21 (×3): 500 mg via ORAL
  Filled 2016-09-20 (×3): qty 2

## 2016-09-20 MED ORDER — METHYLPREDNISOLONE 4 MG PO TBPK: 4.0000 mg | ORAL_TABLET | Freq: Three times a day (TID) | ORAL | Status: DC

## 2016-09-20 MED ORDER — OXYCODONE HCL 5 MG PO TABS
5.0000 mg | ORAL_TABLET | ORAL | Status: DC | PRN
  Administered 2016-09-20: 10 mg via ORAL
  Filled 2016-09-20: qty 2

## 2016-09-20 MED ORDER — CEFAZOLIN SODIUM-DEXTROSE 2-4 GM/100ML-% IV SOLN
2.0000 g | Freq: Three times a day (TID) | INTRAVENOUS | Status: AC
  Administered 2016-09-20 (×2): 2 g via INTRAVENOUS
  Filled 2016-09-20 (×2): qty 100

## 2016-09-20 MED ORDER — DEXAMETHASONE 4 MG PO TABS
10.0000 mg | ORAL_TABLET | Freq: Four times a day (QID) | ORAL | Status: DC
  Administered 2016-09-20 – 2016-09-21 (×3): 10 mg via ORAL
  Filled 2016-09-20 (×3): qty 1

## 2016-09-20 NOTE — Evaluation (Signed)
**Note De-Identified [place] [facility]** Physical Therapy Evaluation Patient Details Name: Hunter Hernandez ClientQuentin Mehring MRN: [id number] DOB: 05/09/1966 Today's Date: 09/20/2016   History of Present Illness  51 yo male s/p C4-5 fusion   Clinical Impression  Patient demonstrates deficits in functional mobility as indicated below. Will need continued skilled PT to address deficits and maximize function. Will see as indicated and progress as tolerated.  Recommending outpatient therapies for follow up WU:[id number]:balance and stability    Follow Up Recommendations Outpatient PT    Equipment Recommendations  Rolling walker with 5" wheels;3in1 (PT)    Recommendations for Other Services       Precautions / Restrictions Precautions Precautions: Cervical      Mobility  Bed Mobility               General bed mobility comments: received in chair  Transfers Overall transfer level: Needs assistance   Transfers: Sit to/from Stand Sit to Stand: Mod assist         General transfer comment: requries use of bil UE  Ambulation/Gait Ambulation/Gait assistance: Min guard Ambulation Distance (Feet): 140 Feet Assistive device: Rolling walker (2 wheeled) Gait Pattern/deviations: Step-through pattern;Decreased stride length;Trunk flexed;Drifts right/left;Shuffle Gait velocity: decreased   General Gait Details: patient with altered gait, flexed posture and decrease hip ROM affecting clearence. Patient ambulates with significant deviations at baseline (3 years). Reports this is his baseline mobility  Stairs Stairs: Yes Stairs assistance: Min guard Stair Management: One rail Left Number of Stairs: 4 General stair comments: min guard for stability  Wheelchair Mobility    Modified Rankin (Stroke Patients Only)       Balance Overall balance assessment: Needs assistance;History of Falls Sitting-balance support: No upper extremity supported;Feet supported Sitting balance-Leahy Scale: Good     Standing balance support: Single extremity  supported;During functional activity Standing balance-Leahy Scale: Fair                               Pertinent Vitals/Pain Pain Assessment: 0-10 Pain Score: 4  Pain Location: throat Pain Descriptors / Indicators: Sore Pain Intervention(s): Monitored during session    Home Living Family/patient expects to be discharged to:: Private residence Living Arrangements: Children (daughter) Available Help at Discharge: Personal care attendant (2 hours / daughter 5pm through the evenings) Type of Home: Apartment Home Access: Level entry     Home Layout: 1/2 bath on main level;Bed/bath upstairs Home Equipment: Walker - 2 wheels;Grab bars - tub/shower Additional Comments: aide present 2 hours a day for three years    Prior Function Level of Independence: Independent with assistive device(s)               Hand Dominance   Dominant Hand: Left    Extremity/Trunk Assessment   Upper Extremity Assessment Upper Extremity Assessment: RUE deficits/detail;LUE deficits/detail RUE Deficits / Details: numbness tingling,  LUE Deficits / Details: numbness tingling,     Lower Extremity Assessment Lower Extremity Assessment: RLE deficits/detail;LLE deficits/detail (Bilateral strength and ROM deficits (baseline) from hip OA)    Cervical / Trunk Assessment Cervical / Trunk Assessment: Other exceptions (s/p surg)  Communication   Communication: No difficulties  Cognition Arousal/Alertness: Awake/alert Behavior During Therapy: WFL for tasks assessed/performed Overall Cognitive Status: Within Functional Limits for tasks assessed **Note De-Identified [place] [facility]** General Comments      Exercises     Assessment/Plan    PT Assessment Patient needs continued PT services  PT Problem List Decreased strength;Decreased activity tolerance;Decreased balance;Decreased mobility;Decreased coordination;Pain       PT Treatment Interventions DME instruction;Gait  training;Stair training;Functional mobility training;Therapeutic activities;Therapeutic exercise;Balance training;Patient/family education    PT Goals (Current goals can be found in the Care Plan section)  Acute Rehab PT Goals Patient Stated Goal: to get back to being able to move better PT Goal Formulation: With patient Time For Goal Achievement: 10/04/16 Potential to Achieve Goals: Good    Frequency Min 5X/week   Barriers to discharge Inaccessible home environment      Co-evaluation               AM-PAC PT "6 Clicks" Daily Activity  Outcome Measure Difficulty turning over in bed (including adjusting bedclothes, sheets and blankets)?: A Lot Difficulty moving from lying on back to sitting on the side of the bed? : A Lot Difficulty sitting down on and standing up from a chair with arms (e.g., wheelchair, bedside commode, etc,.)?: A Lot Help needed moving to and from a bed to chair (including a wheelchair)?: A Little Help needed walking in hospital room?: A Little Help needed climbing 3-5 steps with a railing? : A Little 6 Click Score: 15    End of Session Equipment Utilized During Treatment: Gait belt Activity Tolerance: Patient limited by fatigue Patient left: in chair;with call bell/phone within reach Nurse Communication: Mobility status PT Visit Diagnosis: Unsteadiness on feet (R26.81);Other abnormalities of gait and mobility (R26.89)    Time: 6962-9528 PT Time Calculation (min) (ACUTE ONLY): 16 min   Charges:   PT Evaluation $PT Eval Moderate Complexity: 1 Procedure     PT G Codes:        Charlotte Crumb, PT DPT  [phone]   Fabio Asa 09/20/2016, 1:07 PM

## 2016-09-20 NOTE — Evaluation (Signed)
**Note De-Identified [place] [facility]** Occupational Therapy Evaluation Patient Details Name: Hunter Hernandez MRN: [id number] DOB: 01/30/1966 Today's Date: 09/20/2016    History of Present Illness 51 yo male s/p C4-5 fusion   Past Medical History:  Diagnosis Date  . High cholesterol   . Hypertension       Clinical Impression   Patient is s/p C4-5 fusion surgery resulting in functional limitations due to the deficits listed below (see OT problem list). PTA was home alone during the day with an aide for 2 hours during the day and daughter in the evenings.  Patient will benefit from skilled OT acutely to increase independence and safety with ADLS to allow discharge outpatient.     Follow Up Recommendations  Outpatient OT    Equipment Recommendations  3 in 1 bedside commode;Tub/shower seat;Other (comment) (RW)    Recommendations for Other Services       Precautions / Restrictions Precautions Precautions: Cervical      Mobility Bed Mobility               General bed mobility comments: received in chair  Transfers Overall transfer level: Needs assistance   Transfers: Sit to/from Stand Sit to Stand: Mod assist         General transfer comment: requries use of bil UE    Balance Overall balance assessment: Needs assistance Sitting-balance support: No upper extremity supported;Feet supported Sitting balance-Leahy Scale: Good     Standing balance support: Single extremity supported;During functional activity Standing balance-Leahy Scale: Fair                             ADL either performed or assessed with clinical judgement   ADL Overall ADL's : Needs assistance/impaired Eating/Feeding: Set up   Grooming: Wash/dry hands;Wash/dry face;Oral care;Set up;Standing Grooming Details (indicate cue type and reason): incr time and unsteady with one hand support at times. pt able to open tooth paste and place cap back on paste                 Toilet Transfer: Moderate assistance Toilet  Transfer Details (indicate cue type and reason): pt requires use of bil hands to push up from chair surface         Functional mobility during ADLs: Minimal assistance;Rolling walker General ADL Comments: pt requires cues for RW safety and placement at this time. pt uses RW ahead of himself and normally uses rollator at home  Pt educated on bathing and avoid washing directly on incision. Pt educated to use new wash cloth and towel each day. Pt educated to allow water to run across dressing and not to soak in a tub at this time. Pt advised RN will instruct on any bandages required otherwise is open to air.      Vision Baseline Vision/History: Wears glasses Wears Glasses: Reading only Vision Assessment?: No apparent visual deficits     Perception     Praxis      Pertinent Vitals/Pain Pain Assessment: No/denies pain Pain Score: 4  Pain Location: throat Pain Descriptors / Indicators: Sore Pain Intervention(s): Monitored during session;Premedicated before session;Repositioned     Hand Dominance Left   Extremity/Trunk Assessment Upper Extremity Assessment Upper Extremity Assessment: RUE deficits/detail;LUE deficits/detail RUE Deficits / Details: numbness tingling,  LUE Deficits / Details: numbness tingling,    Lower Extremity Assessment Lower Extremity Assessment: Defer to PT evaluation (reports normal now since surg)   Cervical / Trunk Assessment Cervical / Trunk Assessment: Other exceptions ( **Note De-Identified [place] [facility]** s/p surg)   Communication Communication Communication: No difficulties   Cognition Arousal/Alertness: Awake/alert Behavior During Therapy: WFL for tasks assessed/performed Overall Cognitive Status: Within Functional Limits for tasks assessed                                     General Comments       Exercises     Shoulder Instructions      Home Living Family/patient expects to be discharged to:: Private residence Living Arrangements: Children  (daughter) Available Help at Discharge: Personal care attendant (2 hours / daughter 5pm through the evenings) Type of Home: Apartment Home Access: Level entry     Home Layout: 1/2 bath on main level;Bed/bath upstairs Alternate Level Stairs-Number of Steps: 12-15 Alternate Level Stairs-Rails: Left Bathroom Shower/Tub: Chief Strategy OfficerTub/shower unit   Bathroom Toilet: Standard     Home Equipment: Environmental consultantWalker - 2 wheels;Grab bars - tub/shower   Additional Comments: aide present 2 hours a day for three years      Prior Functioning/Environment Level of Independence: Independent with assistive device(s)                 OT Problem List: Decreased strength;Decreased activity tolerance;Impaired balance (sitting and/or standing);Decreased knowledge of use of DME or AE;Decreased knowledge of precautions;Impaired UE functional use;Pain;Impaired sensation      OT Treatment/Interventions: Self-care/ADL training;Therapeutic exercise;Neuromuscular education;DME and/or AE instruction;Therapeutic activities;Balance training;Patient/family education    OT Goals(Current goals can be found in the care plan section) Acute Rehab OT Goals Patient Stated Goal: to get back to being able to move better Time For Goal Achievement: 10/04/16 Potential to Achieve Goals: Good  OT Frequency: Min 2X/week   Barriers to D/C: Other (comment) (limited )          Co-evaluation              AM-PAC PT "6 Clicks" Daily Activity     Outcome Measure Help from another person eating meals?: None Help from another person taking care of personal grooming?: A Little Help from another person toileting, which includes using toliet, bedpan, or urinal?: A Little Help from another person bathing (including washing, rinsing, drying)?: A Little Help from another person to put on and taking off regular upper body clothing?: A Little Help from another person to put on and taking off regular lower body clothing?: A Lot 6 Click Score:  18   End of Session Equipment Utilized During Treatment: Rolling walker Nurse Communication: Mobility status;Precautions  Activity Tolerance: Patient tolerated treatment well Patient left: Other (comment) (with PT Devon)  OT Visit Diagnosis: Unsteadiness on feet (R26.81)                Time: 7846-96291048-1105 OT Time Calculation (min): 17 min Charges:  OT General Charges $OT Visit: 1 Procedure OT Evaluation $OT Eval Moderate Complexity: 1 Procedure G-Codes:      Mateo FlowJones, Brynn   OTR/L Pager: [phone] Office: [phone] .   Boone MasterJones, Reighan Hipolito B 09/20/2016, 11:46 AM

## 2016-09-20 NOTE — Progress Notes (Signed)
**Note De-Identified [place] [facility]** Received verbal order from on-call MD-> To discontinue methylPREDNISolone (MEDROL DOSEPAK) Discontinue IV dexamethasone(decadron) Only to administer PO decadron at the same dose for which  IV is currently ordered.  Will continue monitor

## 2016-09-20 NOTE — Progress Notes (Signed)
**Note De-Identified [place] [facility]** Patient ID: [name], male   DOB: 06/11/1965, 51 y.o.   MRN: [id number] Patient is much better significant improvement in feeling in his hands he is already ambulating and his voiding.  Strength 5 out of 5 significant improvement in left tricep function lower extremity strength also 5 out of 5  Physical and occupational therapy mobilization today

## 2016-09-20 NOTE — Progress Notes (Signed)
**Note De-Identified [place] [facility]** Patient is alert and oriented X 4. Denies pain. Walked the hallway twice. Has used his walker to and from the bathroom with staff supervision. Patient and partner states that patient has "improved overnight". Will continue to monitor

## 2016-09-21 MED ORDER — CYCLOBENZAPRINE HCL 10 MG PO TABS: 10.0000 mg | ORAL_TABLET | Freq: Three times a day (TID) | ORAL | 1 refills | Status: DC | PRN

## 2016-09-21 MED ORDER — OXYCODONE HCL 5 MG PO TABS: 5.0000 mg | ORAL_TABLET | ORAL | 0 refills | Status: DC | PRN

## 2016-09-21 NOTE — Discharge Instructions (Signed)
**Note De-identified [place] [facility]** Anterior Cervical Diskectomy and Fusion, Care After °Refer to this sheet in the next few weeks. These instructions provide you with information about caring for yourself after your procedure. Your health care provider may also give you more specific instructions. Your treatment has been planned according to current medical practices, but problems sometimes occur. Call your health care provider if you have any problems or questions after your procedure. °What can I expect after the procedure? °After the procedure, it is common to have: °· Neck pain. °· Discomfort when swallowing. °· Slight hoarseness. °Follow these instructions at home: °If You Have a Neck Brace:  °· Wear it as told by your health care provider. Remove it only as told by your health care provider. °· Keep the brace clean and dry. °Incision care  ° °· Follow instructions from your health care provider about how to take care of the cut made during surgery (incision). Make sure you: °¨ Wash your hands with soap and water before you change your bandage (dressing). If soap and water are not available, use hand sanitizer. °¨ Change your dressing as told by your health care provider. °¨ Leave stitches (sutures), skin glue, or adhesive strips in place. These skin closures may need to stay in place for 2 weeks or longer. If adhesive strip edges start to loosen and curl up, you may trim the loose edges. Do not remove adhesive strips completely unless your health care provider tells you to do that. °· Check your incision every day for signs of infection. Watch for: °¨ Redness, swelling, or pain. °¨ Fluid or blood. °¨ Warmth. °¨ Pus or a bad smell. °Managing pain, stiffness, and swelling  °· Take over-the-counter and prescription medicines only as told by your health care provider. °· If directed, apply ice to the injured area. °¨ Put ice in a plastic bag. °¨ Place a towel between your skin and the bag. °¨ Leave the ice on for 20 minutes, 2-3 times per  day. °Activity  °· Return to your normal activities as told by your health care provider. Ask your health care provider what activities are safe for you. °· Do exercises as told by your health care provider. °· Do not lift anything that is heavier than 10 lb (4.5 kg). °General instructions  °· Do not drive or operate heavy machinery while taking prescription pain medicines. °· Do not use any tobacco products, including cigarettes, chewing tobacco, or e-cigarettes. Tobacco can delay healing. If you need help quitting, ask your health care provider. °· Keep all follow-up visits and physical therapy appointments as told by your health care provider. This is important. °Contact a health care provider if: °· You have a fever. °· You have redness, swelling, or pain around your incision. °· You have fluid or blood coming from your incision. °· You have pus or a bad smell coming from your incision. °· You have pain that is not controlled by your pain medicine. °· You have increasing hoarseness or trouble swallowing. °Get help right away if: °· You have severe pain. °· You have sudden numbness or weakness in your arms. °· You have warmth, tenderness, or swelling in your calf. °· You have chest pain. °· You have difficulty breathing. °This information is not intended to replace advice given to you by your health care provider. Make sure you discuss any questions you have with your health care provider. °Document Released: 06/01/2015 Document Revised: 10/11/2015 Document Reviewed: 04/19/2015 °Elsevier Interactive Patient Education © 2017 [employer] ° °

## 2016-09-21 NOTE — Progress Notes (Signed)
**Note De-Identified [place] Hernandez** Physical Therapy Treatment Patient Details Name: Hunter Hernandez MRN: [id number] DOB: 09-02-1965 Today's Date: 09/21/2016    History of Present Illness 51 yo male s/p C4-5 fusion     PT Comments    Pt cont to report numbness in distal RUE. Pt required max VC throughout session to maintain cervical precautions, with pt demonstrating some impulsivity with bending/twisting. Pt's family present today with PT providing education for safety with RW and car transfers. Pt ambulated with RW and presented with shuffled gait with forward flexed trunk req min Hunter Hernandez for safety. Continue with current plan of care.     Follow Up Recommendations  Outpatient PT     Equipment Recommendations  Rolling walker with 5" wheels;3in1 (PT)    Recommendations for Other Services       Precautions / Restrictions Precautions Precautions: Cervical    Mobility  Bed Mobility               General bed mobility comments: received in chair  Transfers Overall transfer level: Needs assistance   Transfers: Sit to/from Stand Sit to Stand: Supervision         General transfer comment: requries use of bil UE  Ambulation/Gait Ambulation/Gait assistance: Min Hunter Hernandez Ambulation Distance (Feet): 120 Feet Assistive device: Rolling walker (2 wheeled) Gait Pattern/deviations: Step-through pattern;Decreased stride length;Shuffle;Trunk flexed Gait velocity: decreased    General Gait Details: Pt req cues to keep walker closer to body to decrease forward flexed trunk. Pt demonstrated shuffled gait with decreased foot clearance, however reports that is his baseline.    Stairs            Wheelchair Mobility    Modified Rankin (Stroke Patients Only)       Balance Overall balance assessment: Needs assistance Sitting-balance support: No upper extremity supported;Feet supported Sitting balance-Leahy Scale: Good     Standing balance support: Bilateral upper extremity supported Standing balance-Leahy  Scale: Fair Standing balance comment: Pt demonstrated fair standing balance without UE with ability to complete toileting with min Hunter Hernandez and VCs to keep RW over top of commode in case of LOB                             Cognition Arousal/Alertness: Awake/alert Behavior During Therapy: WFL for tasks assessed/performed Overall Cognitive Status: Within Functional Limits for tasks assessed                                        Exercises      General Comments General comments (skin integrity, edema, etc.): Pt required max verbal cues throughout session to follow cervical precautions. Pt able to name 2/3 spinal precautions. PT educated pt on correct car transfer technique and maintain RW in front at all times especially in the bathroom and with stand to sit.       Pertinent Vitals/Pain Pain Assessment: Faces Faces Pain Scale: Hurts a little bit Pain Location: throat (Pt reports numbness in R hand ) Pain Descriptors / Indicators: Sore Pain Intervention(s): Monitored during session    Home Living                      Prior Function            PT Goals (current goals can now be found in the care plan section) Acute Rehab PT Goals Patient Stated Goal: **Note De-Identified Hunter Hernandez** to get back to being able to move better PT Goal Formulation: With patient/family Time For Goal Achievement: 10/04/16 Potential to Achieve Goals: Good Progress towards PT goals: Progressing toward goals    Frequency    Min 5X/week      PT Plan      Co-evaluation              AM-PAC PT "6 Clicks" Daily Activity  Outcome Measure  Difficulty turning over in bed (including adjusting bedclothes, sheets and blankets)?: A Lot Difficulty moving from lying on back to sitting on the side of the bed? : A Lot Difficulty sitting down on and standing up from a chair with arms (e.g., wheelchair, bedside commode, etc,.)?: A Little Help needed moving to and from a bed to chair (including a  wheelchair)?: A Little Help needed walking in hospital room?: A Little Help needed climbing 3-5 steps with a railing? : A Little 6 Click Score: 16    End of Session Equipment Utilized During Treatment: Gait belt Activity Tolerance: Patient limited by fatigue Patient left: in chair;with family/visitor present Nurse Communication: Mobility status PT Visit Diagnosis: Unsteadiness on feet (R26.81);Other abnormalities of gait and mobility (R26.89)     Time: [id number] PT Time Calculation (min) (ACUTE ONLY): 15 min  Charges:  $Gait Training: 8-22 mins                    G Codes:       Hunter Hernandez, SPT  [phone]   Hunter Hernandez  09/21/2016, 10:09 AM

## 2016-09-21 NOTE — Progress Notes (Signed)
**Note De-Identified [place] [facility]** Patient given discharge instructions.  All questions and concerns addressed.  Patient left unit with all belongings and equipment by wheelchair accompanied by staff.

## 2016-09-21 NOTE — Discharge Summary (Signed)
**Note De-Identified Addison Obfuscation** Physician Discharge Summary  Patient ID: Hunter Hernandez MRN: [id number] DOB/AGE: [birth year] 51 y.o.  Admit date: 09/19/2016 Discharge date: 09/21/2016  Admission Diagnoses:C4-5 herniated disc, spondylosis, stenosis, cervical myelopathy, Quadraparesis  Discharge Diagnoses: The same Active Problems:   Myelopathy [facility])   Discharged Condition: good  Hospital Course: Dr. Wynetta Emery performed a C4-5 anterior cervical discectomy, fusion, and plating on the patient on 09/19/2016.  The patient's postoperative course was unremarkable. His Quadraparesis quickly resolved after surgery.  On postoperative day #2 the patient looked and felt well. The patient and his wife requested discharge to home. They were given written and oral discharge instructions. All her questions were answered.  Consults: Physical therapy Significant Diagnostic Studies: None Treatments: C4-5 anterior cervical discectomy, fusion, and plating Discharge Exam: Blood pressure (!) 184/89, pulse 74, temperature 98.4 F (36.9 C), temperature source Oral, resp. rate 20, height 5\' 5"  (1.651 m), weight 93.3 kg (205 lb 11 oz), SpO2 99 %. The patient is alert and pleasant. He is moving all 4 extremities well. He has some slight weakness in his bilateral hand grip. His wound is healing well. There is no hematoma or shift. He is ambulating well with a walker for safety.  Disposition: Home  Discharge Instructions    Call MD for:  difficulty breathing, headache or visual disturbances    Complete by:  As directed    Call MD for:  extreme fatigue    Complete by:  As directed    Call MD for:  hives    Complete by:  As directed    Call MD for:  persistant dizziness or light-headedness    Complete by:  As directed    Call MD for:  persistant nausea and vomiting    Complete by:  As directed    Call MD for:  redness, tenderness, or signs of infection (pain, swelling, redness, odor or green/yellow discharge around incision site)    Complete by:  As  directed    Call MD for:  severe uncontrolled pain    Complete by:  As directed    Call MD for:  temperature >100.4    Complete by:  As directed    Diet - low sodium heart healthy    Complete by:  As directed    Discharge instructions    Complete by:  As directed    Call [phone] for a followup appointment. Take a stool softener while you are using pain medications.   Driving Restrictions    Complete by:  As directed    Do not drive for 2 weeks.   Increase activity slowly    Complete by:  As directed    Lifting restrictions    Complete by:  As directed    Do not lift more than 5 pounds. No excessive bending or twisting.   May shower / Bathe    Complete by:  As directed    He may shower after the pain she is removed 3 days after surgery. Leave the incision alone.   No dressing needed    Complete by:  As directed      Allergies as of 09/21/2016   No Known Allergies     Medication List    STOP taking these medications   methylPREDNISolone 4 MG Tbpk tablet Commonly known as:  MEDROL DOSEPAK   naproxen 500 MG tablet Commonly known as:  NAPROSYN     TAKE these medications   amLODipine 10 MG tablet Commonly known as:  NORVASC Take 10 mg **Note De-Identified Melfi Obfuscation** by mouth daily.   aspirin-acetaminophen-caffeine 250-250-65 MG tablet Commonly known as:  EXCEDRIN MIGRAINE Take 2 tablets by mouth every 6 (six) hours as needed (for pain).   cyclobenzaprine 10 MG tablet Commonly known as:  FLEXERIL Take 1 tablet (10 mg total) by mouth 3 (three) times daily as needed for muscle spasms.   hydrochlorothiazide 25 MG tablet Commonly known as:  HYDRODIURIL Take 25 mg by mouth daily.   metoprolol succinate 25 MG 24 hr tablet Commonly known as:  TOPROL-XL Take 25 mg by mouth daily.   oxyCODONE 5 MG immediate release tablet Commonly known as:  Oxy IR/ROXICODONE Take 1-2 tablets (5-10 mg total) by mouth every 4 (four) hours as needed for breakthrough pain.   pravastatin 40 MG tablet Commonly known  as:  PRAVACHOL Take 40 mg by mouth at bedtime.        SignedTressie Stalker: Leovardo Thoman D 09/21/2016, 10:27 AM

## 2016-09-21 NOTE — Progress Notes (Signed)
**Note De-Identified Cancro Obfuscation** CM received call from RN to please arrange for DMe.  CM notified [facility] DME rep, Reggie to please deliver the shower stool, 3n1, and rolling walker to room so pt can discharge. No other CM needs were communicated.

## 2016-09-22 ENCOUNTER — Encounter (HOSPITAL_COMMUNITY): Payer: Self-pay | Admitting: Neurosurgery

## 2016-09-23 NOTE — Anesthesia Postprocedure Evaluation (Addendum)
**Note De-Identified Cargo Obfuscation** Anesthesia Post Note  Patient: Hunter Hernandez  Procedure(s) Performed: Procedure(s) (LRB): Cervical four-five anterior cervical decompression fusion with plating (N/A)  Patient location during evaluation: PACU Anesthesia Type: General Level of consciousness: awake and alert Pain management: pain level controlled Vital Signs Assessment: post-procedure vital signs reviewed and stable Respiratory status: spontaneous breathing, nonlabored ventilation, respiratory function stable and patient connected to nasal cannula oxygen Cardiovascular status: blood pressure returned to baseline and stable Postop Assessment: no signs of nausea or vomiting Anesthetic complications: no       Last Vitals:  Vitals:   09/20/16 2125 09/21/16 0922  BP: (!) 181/91 (!) 184/89  Pulse: 95 74  Resp: 20 20  Temp: 36.9 C 36.9 C    Last Pain:  Vitals:   09/21/16 0922  TempSrc: Oral  PainSc:                  Chrsitopher Wik S

## 2016-09-25 ENCOUNTER — Encounter (HOSPITAL_COMMUNITY): Payer: Self-pay | Admitting: Neurosurgery

## 2016-10-20 NOTE — Addendum Note (Signed)
**Note De-identified Been Obfuscation** Addendum  created 10/20/16 1418 by Garth Diffley, MD   Sign clinical note    

## 2016-10-31 ENCOUNTER — Other Ambulatory Visit: Payer: Self-pay | Admitting: Neurosurgery

## 2016-10-31 DIAGNOSIS — M4802 Spinal stenosis, cervical region: Principal | ICD-10-CM

## 2016-10-31 DIAGNOSIS — G992 Myelopathy in diseases classified elsewhere: Secondary | ICD-10-CM

## 2016-11-05 ENCOUNTER — Ambulatory Visit
Admission: RE | Admit: 2016-11-05 | Discharge: 2016-11-05 | Disposition: A | Discharge status: Home / Self Care | Payer: Medicaid Other | Source: Ambulatory Visit | Attending: Neurosurgery | Admitting: Neurosurgery

## 2016-11-05 DIAGNOSIS — G992 Myelopathy in diseases classified elsewhere: Secondary | ICD-10-CM

## 2016-11-05 DIAGNOSIS — M4802 Spinal stenosis, cervical region: Principal | ICD-10-CM

## 2016-11-11 ENCOUNTER — Other Ambulatory Visit: Payer: Self-pay | Admitting: Neurosurgery

## 2016-12-01 ENCOUNTER — Encounter (HOSPITAL_COMMUNITY)
Admission: RE | Admit: 2016-12-01 | Discharge: 2016-12-01 | Disposition: A | Discharge status: Home / Self Care | Payer: Medicaid Other | Source: Ambulatory Visit | Attending: Neurosurgery | Admitting: Neurosurgery

## 2016-12-01 ENCOUNTER — Encounter (HOSPITAL_COMMUNITY): Payer: Self-pay

## 2016-12-01 DIAGNOSIS — R9431 Abnormal electrocardiogram [ECG] [EKG]: Secondary | ICD-10-CM | POA: Diagnosis not present

## 2016-12-01 DIAGNOSIS — F17210 Nicotine dependence, cigarettes, uncomplicated: Secondary | ICD-10-CM | POA: Diagnosis not present

## 2016-12-01 DIAGNOSIS — Z01812 Encounter for preprocedural laboratory examination: Secondary | ICD-10-CM | POA: Diagnosis not present

## 2016-12-01 DIAGNOSIS — I10 Essential (primary) hypertension: Secondary | ICD-10-CM | POA: Insufficient documentation

## 2016-12-01 DIAGNOSIS — Z01810 Encounter for preprocedural cardiovascular examination: Secondary | ICD-10-CM | POA: Insufficient documentation

## 2016-12-01 DIAGNOSIS — E785 Hyperlipidemia, unspecified: Secondary | ICD-10-CM | POA: Diagnosis not present

## 2016-12-01 DIAGNOSIS — K219 Gastro-esophageal reflux disease without esophagitis: Secondary | ICD-10-CM | POA: Diagnosis not present

## 2016-12-01 HISTORY — DX: Unspecified osteoarthritis, unspecified site: M19.90

## 2016-12-01 HISTORY — DX: Gastro-esophageal reflux disease without esophagitis: K21.9

## 2016-12-01 HISTORY — DX: Adverse effect of unspecified anesthetic, initial encounter: T41.45XA

## 2016-12-01 HISTORY — DX: Myoneural disorder, unspecified: G70.9

## 2016-12-01 HISTORY — DX: Other complications of anesthesia, initial encounter: T88.59XA

## 2016-12-01 LAB — CBC
HEMATOCRIT: 42.8 % (ref 39.0–52.0)
Hemoglobin: 14.2 g/dL (ref 13.0–17.0)
MCH: 28.9 pg (ref 26.0–34.0)
MCHC: 33.2 g/dL (ref 30.0–36.0)
MCV: 87.2 fL (ref 78.0–100.0)
PLATELETS: 216 10*3/uL (ref 150–400)
RBC: 4.91 MIL/uL (ref 4.22–5.81)
RDW: 14.6 % (ref 11.5–15.5)
WBC: 13.2 10*3/uL — ABNORMAL HIGH (ref 4.0–10.5)

## 2016-12-01 LAB — BASIC METABOLIC PANEL
ANION GAP: 7 (ref 5–15)
BUN: 8 mg/dL (ref 6–20)
CALCIUM: 9.4 mg/dL (ref 8.9–10.3)
CO2: 27 mmol/L (ref 22–32)
Chloride: 105 mmol/L (ref 101–111)
Creatinine, Ser: 0.89 mg/dL (ref 0.61–1.24)
GFR calc Af Amer: >60 mL/min (ref >60)
GLUCOSE: 86 mg/dL (ref 65–99)
POTASSIUM: 3.8 mmol/L (ref 3.5–5.1)
Sodium: 139 mmol/L (ref 135–145)

## 2016-12-01 LAB — TYPE AND SCREEN
ABO/RH(D): A POS
Antibody Screen: NEGATIVE

## 2016-12-01 LAB — ABO/RH: ABO/RH(D): A POS

## 2016-12-01 LAB — SURGICAL PCR SCREEN
MRSA, PCR: NEGATIVE
Staphylococcus aureus: POSITIVE — AB

## 2016-12-01 NOTE — Pre-Procedure Instructions (Addendum)
**Note De-Identified Matsumura Obfuscation** Fritzi MandesQuentin Vent  12/01/2016      [facility] [address] - [place], [place] - [id number] [place] [address] Phone: [phone] Fax: [phone]    Your procedure is scheduled on 12/04/16  [facility] at 530 A.M.  Call this number if you have problems the morning of surgery:  [phone]   Remember:  Do not eat food or drink liquids after midnight.  Take these medicines the morning of surgery with A SIP OF WATER     Amlodipine(norvasc),flexeril if needed, metoprolol(toprol), ocycodone if needed  STOP all herbel meds, nsaids (aleve,naproxen,advil,ibuprofen) prior to surgery starting  Now 12/01/16 including all vitamins/supplements, aspirin, excederin migraine   Do not wear jewelry, make-up or nail polish.  Do not wear lotions, powders, or perfumes, or deoderant.  Do not shave 48 hours prior to surgery.  Men may shave face and neck.  Do not bring valuables to the hospital.  [facility] is not responsible for any belongings or valuables.  Contacts, dentures or bridgework may not be worn into surgery.  Leave your suitcase in the car.  After surgery it may be brought to your room.  For patients admitted to the hospital, discharge time will be determined by your treatment team.  Patients discharged the day of surgery will not be allowed to drive home.   Special instructions:   Special Instructions: [place] - Preparing for Surgery  Before surgery, you can play an important role.  Because skin is not sterile, your skin needs to be as free of germs as possible.  You can reduce the number of germs on you skin by washing with CHG (chlorahexidine gluconate) soap before surgery.  CHG is an antiseptic cleaner which kills germs and bonds with the skin to continue killing germs even after washing.  Please DO NOT use if you have an allergy to CHG or antibacterial soaps.  If your skin becomes reddened/irritated stop using the  CHG and inform your nurse when you arrive at Short Stay.  Do not shave (including legs and underarms) for at least 48 hours prior to the first CHG shower.  You may shave your face.  Please follow these instructions carefully:   1.  Shower with CHG Soap the night before surgery and the morning of Surgery.  2.  If you choose to wash your hair, wash your hair first as usual with your normal shampoo.  3.  After you shampoo, rinse your hair and body thoroughly to remove the Shampoo.  4.  Use CHG as you would any other liquid soap.  You can apply chg directly  to the skin and wash gently with scrungie or a clean washcloth.  5.  Apply the CHG Soap to your body ONLY FROM THE NECK DOWN.  Do not use on open wounds or open sores.  Avoid contact with your eyes ears, mouth and genitals (private parts).  Wash genitals (private parts)       with your normal soap.  6.  Wash thoroughly, paying special attention to the area where your surgery will be performed.  7.  Thoroughly rinse your body with warm water from the neck down.  8.  DO NOT shower/wash with your normal soap after using and rinsing off the CHG Soap.  9.  Pat yourself dry with a clean towel.            10.  Wear clean pajamas. **Note De-Identified Peloso Obfuscation** 11.  Place clean sheets on your bed the night of your first shower and do not sleep with pets.  Day of Surgery  Do not apply any lotions/deodorants the morning of surgery.  Please wear clean clothes to the hospital/surgery center.  Please read over the  fact sheets that you were given.

## 2016-12-02 NOTE — Progress Notes (Signed)
**Note De-Identified Cacho Obfuscation** Anesthesia Chart Review:  Pt is a 51 year old male scheduled for C3-4, C4-5, C5-6 posterior cervical laminectomy, foraminotomies, posterior lateral fusion with lateral mass screws and 12/04/2016 with Donalee CitrinGary Cram, M.D.  - PCP is Gwinda PasseMichelle Edwards, NP  PMH includes: HTN, hyperlipidemia, GERD. Current smoker. BMI 34. S/p ACDF 09/19/16.   Anesthesia history: Pt reports during hip surgery 2 years ago his "heart stopped for a minute" but he does not recall where this procedure took place.  [facility] but there are no records of any such surgery/event at either facility.    Medications include: Amlodipine, HCTZ, pravastatin  BP (!) 175/97   Pulse 94   Temp 36.7 C   Resp 20   Ht 5\' 5"  (1.651 m)   Wt 202 lb 3.2 oz (91.7 kg)   SpO2 97%   BMI 33.65 kg/m   - Pt reports he had not taken BP meds the morning of PAT appt. PAT RN instructed pt to take meds as prescribed and notified him surgery could be cancelled if BP too high DOS.  Pt to monitor BP at home and contact PCP if remains high despite taking meds as prescribed.   Preoperative labs reviewed.  EKG 12/01/16: NSR. Nonspecific T wave abnormality  If BP acceptable DOS, I anticipate pt can proceed as scheduled.   Rica Mastngela Charmin Aguiniga, FNP-BC [facility]Anesthesiology Phone: [phone] 12/02/2016 10:53 AM

## 2016-12-03 NOTE — Anesthesia Preprocedure Evaluation (Addendum)
**Note De-Identified Degregorio Obfuscation** Anesthesia Evaluation  Patient identified by MRN, date of birth, ID band Patient awake    Reviewed: Allergy & Precautions, NPO status , Patient's Chart, lab work & pertinent test results  History of Anesthesia Complications Negative for: history of anesthetic complications  Airway Mallampati: II  TM Distance: >3 FB Neck ROM: Full    Dental no notable dental hx. (+) Dental Advisory Given, Edentulous Lower, Edentulous Upper   Pulmonary Current Smoker,    Pulmonary exam normal        Cardiovascular hypertension, Normal cardiovascular exam     Neuro/Psych negative neurological ROS  negative psych ROS   GI/Hepatic Neg liver ROS, GERD  ,  Endo/Other  negative endocrine ROS  Renal/GU negative Renal ROS  negative genitourinary   Musculoskeletal negative musculoskeletal ROS (+)   Abdominal   Peds negative pediatric ROS (+)  Hematology negative hematology ROS (+)   Anesthesia Other Findings   Reproductive/Obstetrics negative OB ROS                            Anesthesia Physical  Anesthesia Plan  ASA: II  Anesthesia Plan: General   Post-op Pain Management:    Induction: Intravenous  PONV Risk Score and Plan: 3 and Ondansetron, Dexamethasone and Scopolamine patch - Pre-op  Airway Management Planned: Oral ETT  Additional Equipment:   Intra-op Plan:   Post-operative Plan: Extubation in OR  Informed Consent: I have reviewed the patients History and Physical, chart, labs and discussed the procedure including the risks, benefits and alternatives for the proposed anesthesia with the patient or authorized representative who has indicated his/her understanding and acceptance.   Dental advisory given and History available from chart only  Plan Discussed with: CRNA and Anesthesiologist  Anesthesia Plan Comments:        Anesthesia Quick Evaluation

## 2016-12-04 ENCOUNTER — Inpatient Hospital Stay (HOSPITAL_COMMUNITY): Payer: Medicaid Other

## 2016-12-04 ENCOUNTER — Encounter (HOSPITAL_COMMUNITY): Payer: Self-pay | Admitting: *Deleted

## 2016-12-04 ENCOUNTER — Encounter (HOSPITAL_COMMUNITY)
Admission: RE | Disposition: A | Discharge status: Home / Self Care | Payer: Self-pay | Source: Ambulatory Visit | Attending: Neurosurgery

## 2016-12-04 ENCOUNTER — Inpatient Hospital Stay (HOSPITAL_COMMUNITY): Payer: Medicaid Other | Admitting: Emergency Medicine

## 2016-12-04 ENCOUNTER — Inpatient Hospital Stay (HOSPITAL_COMMUNITY): Payer: Medicaid Other | Admitting: Certified Registered"

## 2016-12-04 ENCOUNTER — Inpatient Hospital Stay (HOSPITAL_COMMUNITY)
Admission: RE | Admit: 2016-12-04 | Discharge: 2016-12-05 | DRG: 473 | Disposition: A | Discharge status: Home / Self Care | Payer: Medicaid Other | Source: Ambulatory Visit | Attending: Neurosurgery | Admitting: Neurosurgery

## 2016-12-04 DIAGNOSIS — M542 Cervicalgia: Secondary | ICD-10-CM | POA: Diagnosis present

## 2016-12-04 DIAGNOSIS — F17210 Nicotine dependence, cigarettes, uncomplicated: Secondary | ICD-10-CM | POA: Diagnosis present

## 2016-12-04 DIAGNOSIS — S14129A Central cord syndrome at unspecified level of cervical spinal cord, initial encounter: Secondary | ICD-10-CM | POA: Diagnosis present

## 2016-12-04 DIAGNOSIS — M4802 Spinal stenosis, cervical region: Secondary | ICD-10-CM | POA: Diagnosis present

## 2016-12-04 DIAGNOSIS — Z981 Arthrodesis status: Secondary | ICD-10-CM

## 2016-12-04 DIAGNOSIS — M4712 Other spondylosis with myelopathy, cervical region: Secondary | ICD-10-CM | POA: Diagnosis present

## 2016-12-04 DIAGNOSIS — X58XXXA Exposure to other specified factors, initial encounter: Secondary | ICD-10-CM | POA: Diagnosis present

## 2016-12-04 DIAGNOSIS — K219 Gastro-esophageal reflux disease without esophagitis: Secondary | ICD-10-CM | POA: Diagnosis present

## 2016-12-04 DIAGNOSIS — Z79899 Other long term (current) drug therapy: Secondary | ICD-10-CM

## 2016-12-04 DIAGNOSIS — Z96643 Presence of artificial hip joint, bilateral: Secondary | ICD-10-CM | POA: Diagnosis present

## 2016-12-04 DIAGNOSIS — G959 Disease of spinal cord, unspecified: Secondary | ICD-10-CM | POA: Diagnosis present

## 2016-12-04 DIAGNOSIS — I10 Essential (primary) hypertension: Secondary | ICD-10-CM | POA: Diagnosis present

## 2016-12-04 DIAGNOSIS — E7800 Pure hypercholesterolemia, unspecified: Secondary | ICD-10-CM | POA: Diagnosis present

## 2016-12-04 DIAGNOSIS — Z419 Encounter for procedure for purposes other than remedying health state, unspecified: Secondary | ICD-10-CM

## 2016-12-04 HISTORY — PX: POSTERIOR CERVICAL FUSION/FORAMINOTOMY: SHX5038

## 2016-12-04 SURGERY — POSTERIOR CERVICAL FUSION/FORAMINOTOMY LEVEL 3
Anesthesia: General | Site: Spine Cervical

## 2016-12-04 MED ORDER — ONDANSETRON HCL 4 MG/2ML IJ SOLN
INTRAMUSCULAR | Status: AC
  Filled 2016-12-04: qty 2

## 2016-12-04 MED ORDER — OXYCODONE HCL 5 MG PO TABS
5.0000 mg | ORAL_TABLET | ORAL | Status: DC | PRN
  Administered 2016-12-04: 10 mg via ORAL
  Administered 2016-12-04: 5 mg via ORAL
  Administered 2016-12-05 (×2): 10 mg via ORAL
  Filled 2016-12-04 (×3): qty 2

## 2016-12-04 MED ORDER — OXYCODONE HCL 5 MG PO TABS
ORAL_TABLET | ORAL | Status: AC
  Administered 2016-12-04: 5 mg via ORAL
  Filled 2016-12-04: qty 1

## 2016-12-04 MED ORDER — ACETAMINOPHEN 325 MG PO TABS: 650.0000 mg | ORAL_TABLET | ORAL | Status: DC | PRN

## 2016-12-04 MED ORDER — PHENOL 1.4 % MT LIQD: 1.0000 | OROMUCOSAL | Status: DC | PRN

## 2016-12-04 MED ORDER — CYCLOBENZAPRINE HCL 10 MG PO TABS
ORAL_TABLET | ORAL | Status: AC
  Administered 2016-12-04: 10 mg via ORAL
  Filled 2016-12-04: qty 1

## 2016-12-04 MED ORDER — CEFAZOLIN SODIUM-DEXTROSE 2-4 GM/100ML-% IV SOLN
2.0000 g | INTRAVENOUS | Status: AC
  Administered 2016-12-04: 2 g via INTRAVENOUS
  Filled 2016-12-04: qty 100

## 2016-12-04 MED ORDER — PROPOFOL 10 MG/ML IV BOLUS
INTRAVENOUS | Status: AC
  Filled 2016-12-04: qty 20

## 2016-12-04 MED ORDER — ROCURONIUM BROMIDE 50 MG/5ML IV SOLN
INTRAVENOUS | Status: AC
  Filled 2016-12-04: qty 1

## 2016-12-04 MED ORDER — HYDROCHLOROTHIAZIDE 25 MG PO TABS
25.0000 mg | ORAL_TABLET | Freq: Every day | ORAL | Status: DC
  Administered 2016-12-04 – 2016-12-05 (×2): 25 mg via ORAL
  Filled 2016-12-04 (×2): qty 1

## 2016-12-04 MED ORDER — BACITRACIN ZINC 500 UNIT/GM EX OINT
TOPICAL_OINTMENT | CUTANEOUS | Status: AC
  Filled 2016-12-04: qty 28.35

## 2016-12-04 MED ORDER — THROMBIN 5000 UNITS EX SOLR
CUTANEOUS | Status: AC
  Filled 2016-12-04: qty 5000

## 2016-12-04 MED ORDER — MIDAZOLAM HCL 2 MG/2ML IJ SOLN
INTRAMUSCULAR | Status: AC
  Filled 2016-12-04: qty 2

## 2016-12-04 MED ORDER — SENNOSIDES-DOCUSATE SODIUM 8.6-50 MG PO TABS: 1.0000 | ORAL_TABLET | Freq: Every evening | ORAL | Status: DC | PRN

## 2016-12-04 MED ORDER — ALUM & MAG HYDROXIDE-SIMETH 200-200-20 MG/5ML PO SUSP: 30.0000 mL | Freq: Four times a day (QID) | ORAL | Status: DC | PRN

## 2016-12-04 MED ORDER — MUPIROCIN 2 % EX OINT
TOPICAL_OINTMENT | CUTANEOUS | Status: AC
  Administered 2016-12-04: 1 via TOPICAL
  Filled 2016-12-04: qty 22

## 2016-12-04 MED ORDER — SCOPOLAMINE 1 MG/3DAYS TD PT72
1.0000 | MEDICATED_PATCH | TRANSDERMAL | Status: DC
  Administered 2016-12-04: 1 via TRANSDERMAL
  Administered 2016-12-04: 1.5 mg via TRANSDERMAL
  Filled 2016-12-04: qty 1

## 2016-12-04 MED ORDER — CYCLOBENZAPRINE HCL 10 MG PO TABS
10.0000 mg | ORAL_TABLET | Freq: Three times a day (TID) | ORAL | Status: DC | PRN
  Administered 2016-12-04 – 2016-12-05 (×2): 10 mg via ORAL
  Filled 2016-12-04: qty 1

## 2016-12-04 MED ORDER — HYDROMORPHONE HCL 1 MG/ML IJ SOLN
INTRAMUSCULAR | Status: AC
  Administered 2016-12-04: 0.5 mg via INTRAVENOUS
  Filled 2016-12-04: qty 0.5

## 2016-12-04 MED ORDER — PHENYLEPHRINE HCL 10 MG/ML IJ SOLN
INTRAVENOUS | Status: DC | PRN
  Administered 2016-12-04: 25 ug/min via INTRAVENOUS

## 2016-12-04 MED ORDER — SUGAMMADEX SODIUM 200 MG/2ML IV SOLN
INTRAVENOUS | Status: AC
  Filled 2016-12-04: qty 2

## 2016-12-04 MED ORDER — PANTOPRAZOLE SODIUM 40 MG IV SOLR
40.0000 mg | Freq: Every day | INTRAVENOUS | Status: DC
  Administered 2016-12-04: 40 mg via INTRAVENOUS
  Filled 2016-12-04: qty 40

## 2016-12-04 MED ORDER — ONDANSETRON HCL 4 MG/2ML IJ SOLN
INTRAMUSCULAR | Status: DC | PRN
  Administered 2016-12-04: 4 mg via INTRAVENOUS

## 2016-12-04 MED ORDER — LIDOCAINE-EPINEPHRINE 1 %-1:100000 IJ SOLN
INTRAMUSCULAR | Status: AC
Start: 2016-12-04 — End: ?
  Filled 2016-12-04: qty 1

## 2016-12-04 MED ORDER — CLONIDINE HCL 0.1 MG PO TABS
0.1000 mg | ORAL_TABLET | Freq: Once | ORAL | Status: AC
  Administered 2016-12-05: 0.1 mg via ORAL
  Filled 2016-12-04: qty 1

## 2016-12-04 MED ORDER — SODIUM CHLORIDE 0.9% FLUSH: 3.0000 mL | INTRAVENOUS | Status: DC | PRN

## 2016-12-04 MED ORDER — CHLORHEXIDINE GLUCONATE CLOTH 2 % EX PADS: 6.0000 | MEDICATED_PAD | Freq: Once | CUTANEOUS | Status: DC

## 2016-12-04 MED ORDER — CEFAZOLIN SODIUM-DEXTROSE 2-4 GM/100ML-% IV SOLN
2.0000 g | Freq: Three times a day (TID) | INTRAVENOUS | Status: AC
  Administered 2016-12-04 – 2016-12-05 (×2): 2 g via INTRAVENOUS
  Filled 2016-12-04 (×2): qty 100

## 2016-12-04 MED ORDER — LIDOCAINE-EPINEPHRINE 1 %-1:100000 IJ SOLN
INTRAMUSCULAR | Status: DC | PRN
  Administered 2016-12-04: 9 mL

## 2016-12-04 MED ORDER — AMLODIPINE BESYLATE 10 MG PO TABS
10.0000 mg | ORAL_TABLET | Freq: Every day | ORAL | Status: DC
  Administered 2016-12-04 – 2016-12-05 (×2): 10 mg via ORAL
  Filled 2016-12-04 (×3): qty 1

## 2016-12-04 MED ORDER — SUGAMMADEX SODIUM 500 MG/5ML IV SOLN
INTRAVENOUS | Status: DC | PRN
  Administered 2016-12-04: 300 mg via INTRAVENOUS

## 2016-12-04 MED ORDER — SODIUM CHLORIDE 0.9% FLUSH
3.0000 mL | Freq: Two times a day (BID) | INTRAVENOUS | Status: DC
  Administered 2016-12-04: 3 mL via INTRAVENOUS

## 2016-12-04 MED ORDER — SCOPOLAMINE 1 MG/3DAYS TD PT72
MEDICATED_PATCH | TRANSDERMAL | Status: AC
  Filled 2016-12-04: qty 1

## 2016-12-04 MED ORDER — HYDROMORPHONE HCL 1 MG/ML IJ SOLN
1.0000 mg | INTRAMUSCULAR | Status: DC | PRN
  Administered 2016-12-04: 1 mg via INTRAVENOUS
  Filled 2016-12-04: qty 1

## 2016-12-04 MED ORDER — MIDAZOLAM HCL 5 MG/5ML IJ SOLN
INTRAMUSCULAR | Status: DC | PRN
  Administered 2016-12-04: 2 mg via INTRAVENOUS

## 2016-12-04 MED ORDER — 0.9 % SODIUM CHLORIDE (POUR BTL) OPTIME
TOPICAL | Status: DC | PRN
  Administered 2016-12-04: 1000 mL

## 2016-12-04 MED ORDER — MENTHOL 3 MG MT LOZG: 1.0000 | LOZENGE | OROMUCOSAL | Status: DC | PRN

## 2016-12-04 MED ORDER — SUGAMMADEX SODIUM 500 MG/5ML IV SOLN
INTRAVENOUS | Status: AC
  Filled 2016-12-04: qty 5

## 2016-12-04 MED ORDER — FENTANYL CITRATE (PF) 250 MCG/5ML IJ SOLN
INTRAMUSCULAR | Status: AC
  Filled 2016-12-04: qty 5

## 2016-12-04 MED ORDER — SUFENTANIL CITRATE 50 MCG/ML IV SOLN
INTRAVENOUS | Status: AC
  Filled 2016-12-04: qty 1

## 2016-12-04 MED ORDER — ROCURONIUM BROMIDE 50 MG/5ML IV SOLN
INTRAVENOUS | Status: AC
  Filled 2016-12-04: qty 3

## 2016-12-04 MED ORDER — BUPIVACAINE HCL (PF) 0.25 % IJ SOLN
INTRAMUSCULAR | Status: DC | PRN
  Administered 2016-12-04: 10 mL

## 2016-12-04 MED ORDER — PROMETHAZINE HCL 25 MG/ML IJ SOLN: 6.2500 mg | INTRAMUSCULAR | Status: DC | PRN

## 2016-12-04 MED ORDER — PRAVASTATIN SODIUM 40 MG PO TABS
40.0000 mg | ORAL_TABLET | Freq: Every day | ORAL | Status: DC
  Administered 2016-12-04: 40 mg via ORAL
  Filled 2016-12-04: qty 1

## 2016-12-04 MED ORDER — LIDOCAINE HCL (CARDIAC) 20 MG/ML IV SOLN
INTRAVENOUS | Status: AC
  Filled 2016-12-04: qty 10

## 2016-12-04 MED ORDER — ONDANSETRON HCL 4 MG/2ML IJ SOLN: 4.0000 mg | Freq: Four times a day (QID) | INTRAMUSCULAR | Status: DC | PRN

## 2016-12-04 MED ORDER — BACITRACIN ZINC 500 UNIT/GM EX OINT
TOPICAL_OINTMENT | CUTANEOUS | Status: DC | PRN
  Administered 2016-12-04: 1 via TOPICAL

## 2016-12-04 MED ORDER — LIDOCAINE 2% (20 MG/ML) 5 ML SYRINGE
INTRAMUSCULAR | Status: DC | PRN
  Administered 2016-12-04: 100 mg via INTRAVENOUS

## 2016-12-04 MED ORDER — HYDROMORPHONE HCL 1 MG/ML IJ SOLN
INTRAMUSCULAR | Status: AC
  Administered 2016-12-04: 0.5 mg via INTRAVENOUS
  Filled 2016-12-04: qty 1

## 2016-12-04 MED ORDER — HYDROMORPHONE HCL 1 MG/ML IJ SOLN
0.2500 mg | INTRAMUSCULAR | Status: DC | PRN
  Administered 2016-12-04 (×4): 0.5 mg via INTRAVENOUS

## 2016-12-04 MED ORDER — DOCUSATE SODIUM 100 MG PO CAPS
100.0000 mg | ORAL_CAPSULE | Freq: Two times a day (BID) | ORAL | Status: DC
  Administered 2016-12-04 – 2016-12-05 (×2): 100 mg via ORAL
  Filled 2016-12-04 (×2): qty 1

## 2016-12-04 MED ORDER — THROMBIN 20000 UNITS EX SOLR
CUTANEOUS | Status: DC | PRN
  Administered 2016-12-04: 20 mL via TOPICAL

## 2016-12-04 MED ORDER — PROPOFOL 10 MG/ML IV BOLUS
INTRAVENOUS | Status: DC | PRN
  Administered 2016-12-04: 200 mg via INTRAVENOUS

## 2016-12-04 MED ORDER — FENTANYL CITRATE (PF) 250 MCG/5ML IJ SOLN
INTRAMUSCULAR | Status: DC | PRN
  Administered 2016-12-04 (×2): 100 ug via INTRAVENOUS
  Administered 2016-12-04: 150 ug via INTRAVENOUS

## 2016-12-04 MED ORDER — DEXAMETHASONE SODIUM PHOSPHATE 10 MG/ML IJ SOLN
10.0000 mg | INTRAMUSCULAR | Status: AC
Start: 2016-12-04 — End: 2016-12-04
  Administered 2016-12-04: 10 mg via INTRAVENOUS
  Filled 2016-12-04: qty 1

## 2016-12-04 MED ORDER — ROCURONIUM BROMIDE 10 MG/ML (PF) SYRINGE
PREFILLED_SYRINGE | INTRAVENOUS | Status: DC | PRN
  Administered 2016-12-04: 70 mg via INTRAVENOUS
  Administered 2016-12-04: 10 mg via INTRAVENOUS

## 2016-12-04 MED ORDER — MUPIROCIN 2 % EX OINT
1.0000 "application " | TOPICAL_OINTMENT | Freq: Two times a day (BID) | CUTANEOUS | Status: DC
  Administered 2016-12-04: 1 via TOPICAL

## 2016-12-04 MED ORDER — SODIUM CHLORIDE 0.9 % IR SOLN
Status: DC | PRN
  Administered 2016-12-04: 500 mL

## 2016-12-04 MED ORDER — LIDOCAINE HCL (CARDIAC) 20 MG/ML IV SOLN
INTRAVENOUS | Status: AC
  Filled 2016-12-04: qty 5

## 2016-12-04 MED ORDER — ACETAMINOPHEN 650 MG RE SUPP: 650.0000 mg | RECTAL | Status: DC | PRN

## 2016-12-04 MED ORDER — BUPIVACAINE HCL (PF) 0.25 % IJ SOLN
INTRAMUSCULAR | Status: AC
  Filled 2016-12-04: qty 30

## 2016-12-04 MED ORDER — THROMBIN 20000 UNITS EX SOLR
CUTANEOUS | Status: AC
  Filled 2016-12-04: qty 20000

## 2016-12-04 MED ORDER — LACTATED RINGERS IV SOLN
INTRAVENOUS | Status: DC | PRN
  Administered 2016-12-04 (×2): via INTRAVENOUS

## 2016-12-04 MED ORDER — CYCLOBENZAPRINE HCL 10 MG PO TABS: 10.0000 mg | ORAL_TABLET | Freq: Three times a day (TID) | ORAL | Status: DC | PRN

## 2016-12-04 MED ORDER — ONDANSETRON HCL 4 MG PO TABS: 4.0000 mg | ORAL_TABLET | Freq: Four times a day (QID) | ORAL | Status: DC | PRN

## 2016-12-04 MED ORDER — SODIUM CHLORIDE 0.9 % IJ SOLN
INTRAMUSCULAR | Status: AC
  Filled 2016-12-04: qty 10

## 2016-12-04 SURGICAL SUPPLY — 80 items
ADH SKN CLS APL DERMABOND .7 (GAUZE/BANDAGES/DRESSINGS) ×1
ALLOFUSE SELECT 5CC (Bone Implant) ×2 IMPLANT
ALLOFUSE SELECT CM 5CC (Bone Implant) ×1 IMPLANT
APL SKNCLS STERI-STRIP NONHPOA (GAUZE/BANDAGES/DRESSINGS) ×1
BAG DECANTER FOR FLEXI CONT (MISCELLANEOUS) ×3 IMPLANT
BENZOIN TINCTURE PRP APPL 2/3 (GAUZE/BANDAGES/DRESSINGS) ×4 IMPLANT
BIT DRILL SCRW 3.5 (BIT) ×2 IMPLANT
BLADE CLIPPER SURG (BLADE) ×3 IMPLANT
BLADE SURG 11 STRL SS (BLADE) ×3 IMPLANT
BUR MATCHSTICK NEURO 3.0 LAGG (BURR) ×3 IMPLANT
CANISTER SUCT 3000ML PPV (MISCELLANEOUS) ×3 IMPLANT
CAP LOCKING (Cap) IMPLANT
CARTRIDGE OIL MAESTRO DRILL (MISCELLANEOUS) ×1 IMPLANT
CLOSURE STERI-STRIP 1/2X4 (GAUZE/BANDAGES/DRESSINGS) ×1
CLOSURE WOUND 1/2 X4 (GAUZE/BANDAGES/DRESSINGS) ×1
CLSR STERI-STRIP ANTIMIC 1/2X4 (GAUZE/BANDAGES/DRESSINGS) ×1 IMPLANT
DECANTER SPIKE VIAL GLASS SM (MISCELLANEOUS) ×1 IMPLANT
DERMABOND ADVANCED (GAUZE/BANDAGES/DRESSINGS) ×2
DERMABOND ADVANCED .7 DNX12 (GAUZE/BANDAGES/DRESSINGS) ×1 IMPLANT
DIFFUSER DRILL AIR PNEUMATIC (MISCELLANEOUS) ×3 IMPLANT
DRAPE C-ARM 42X72 X-RAY (DRAPES) ×2 IMPLANT
DRAPE LAPAROTOMY 100X72 PEDS (DRAPES) ×3 IMPLANT
DRAPE MICROSCOPE LEICA (MISCELLANEOUS) IMPLANT
DRAPE POUCH INSTRU U-SHP 10X18 (DRAPES) ×3 IMPLANT
DRAPE SURG 17X23 STRL (DRAPES) ×12 IMPLANT
DRSG OPSITE 4X5.5 SM (GAUZE/BANDAGES/DRESSINGS) ×2 IMPLANT
DRSG OPSITE POSTOP 4X6 (GAUZE/BANDAGES/DRESSINGS) ×2 IMPLANT
DURAPREP 6ML APPLICATOR 50/CS (WOUND CARE) ×3 IMPLANT
ELECT REM PT RETURN 9FT ADLT (ELECTROSURGICAL) ×3
ELECTRODE REM PT RTRN 9FT ADLT (ELECTROSURGICAL) ×1 IMPLANT
GAUZE SPONGE 4X4 12PLY STRL (GAUZE/BANDAGES/DRESSINGS) ×3 IMPLANT
GAUZE SPONGE 4X4 16PLY XRAY LF (GAUZE/BANDAGES/DRESSINGS) ×2 IMPLANT
GLOVE BIO SURGEON STRL SZ7 (GLOVE) ×2 IMPLANT
GLOVE BIO SURGEON STRL SZ8 (GLOVE) ×3 IMPLANT
GLOVE BIOGEL PI IND STRL 7.0 (GLOVE) IMPLANT
GLOVE BIOGEL PI IND STRL 7.5 (GLOVE) IMPLANT
GLOVE BIOGEL PI INDICATOR 7.0 (GLOVE) ×4
GLOVE BIOGEL PI INDICATOR 7.5 (GLOVE) ×2
GLOVE ECLIPSE 6.5 STRL STRAW (GLOVE) ×4 IMPLANT
GLOVE EXAM NITRILE LRG STRL (GLOVE) IMPLANT
GLOVE EXAM NITRILE XL STR (GLOVE) IMPLANT
GLOVE EXAM NITRILE XS STR PU (GLOVE) IMPLANT
GLOVE INDICATOR 8.5 STRL (GLOVE) ×3 IMPLANT
GLOVE SS BIOGEL STRL SZ 7 (GLOVE) IMPLANT
GLOVE SUPERSENSE BIOGEL SZ 7 (GLOVE) ×2
GOWN STRL REUS W/ TWL LRG LVL3 (GOWN DISPOSABLE) IMPLANT
GOWN STRL REUS W/ TWL XL LVL3 (GOWN DISPOSABLE) ×1 IMPLANT
GOWN STRL REUS W/TWL 2XL LVL3 (GOWN DISPOSABLE) IMPLANT
GOWN STRL REUS W/TWL LRG LVL3 (GOWN DISPOSABLE) ×12
GOWN STRL REUS W/TWL XL LVL3 (GOWN DISPOSABLE) ×3
IMPL QUARTEX 3.5X12MM (Neuro Prosthesis/Implant) IMPLANT
IMPLANT QUARTEX 3.5X12MM (Neuro Prosthesis/Implant) ×21 IMPLANT
KIT BASIN OR (CUSTOM PROCEDURE TRAY) ×3 IMPLANT
KIT ROOM TURNOVER OR (KITS) ×3 IMPLANT
LOCKING CAP (Cap) ×21 IMPLANT
MARKER SKIN DUAL TIP RULER LAB (MISCELLANEOUS) ×3 IMPLANT
NDL HYPO 25X1 1.5 SAFETY (NEEDLE) ×1 IMPLANT
NDL SPNL 20GX3.5 QUINCKE YW (NEEDLE) ×1 IMPLANT
NEEDLE HYPO 25X1 1.5 SAFETY (NEEDLE) ×3 IMPLANT
NEEDLE SPNL 20GX3.5 QUINCKE YW (NEEDLE) ×3 IMPLANT
NS IRRIG 1000ML POUR BTL (IV SOLUTION) ×3 IMPLANT
OIL CARTRIDGE MAESTRO DRILL (MISCELLANEOUS) ×3
PACK LAMINECTOMY NEURO (CUSTOM PROCEDURE TRAY) ×3 IMPLANT
PAD ARMBOARD 7.5X6 YLW CONV (MISCELLANEOUS) ×13 IMPLANT
PIN MAYFIELD SKULL DISP (PIN) ×3 IMPLANT
ROD ELLIPSE 120MM (Rod) ×2 IMPLANT
RUBBERBAND STERILE (MISCELLANEOUS) IMPLANT
SPONGE LAP 4X18 X RAY DECT (DISPOSABLE) IMPLANT
SPONGE SURGIFOAM ABS GEL 100 (HEMOSTASIS) ×3 IMPLANT
STRIP CLOSURE SKIN 1/2X4 (GAUZE/BANDAGES/DRESSINGS) ×2 IMPLANT
SUT ETHILON 4 0 PS 2 18 (SUTURE) IMPLANT
SUT VIC AB 0 CT1 18XCR BRD8 (SUTURE) ×1 IMPLANT
SUT VIC AB 0 CT1 8-18 (SUTURE) ×3
SUT VIC AB 2-0 CT1 18 (SUTURE) ×3 IMPLANT
SUT VIC AB 4-0 PS2 27 (SUTURE) ×3 IMPLANT
TISSUE ALLOFUSE SELECT 5CC (Bone Implant) IMPLANT
TOWEL GREEN STERILE (TOWEL DISPOSABLE) ×3 IMPLANT
TOWEL GREEN STERILE FF (TOWEL DISPOSABLE) ×3 IMPLANT
TRAY FOLEY W/METER SILVER 16FR (SET/KITS/TRAYS/PACK) ×2 IMPLANT
WATER STERILE IRR 1000ML POUR (IV SOLUTION) ×3 IMPLANT

## 2016-12-04 NOTE — H&P (Signed)
**Note De-Identified Hunter Hernandez** Hunter Hernandez is an 51 y.o. male.   Chief Complaint: Cervical spondylitic myelopathy HPI: 51 year old gentleman presented to the ER several months ago with weakness in his upper and lower 70s and exam consistent with central cord syndrome. Patient went emergent anterior cervical discectomies and fusion at C4-5 and C5-6. Patient improved significantly however has had persistent numbness tingling and some intermittent weakness proximally in his deltoids. Follow-up imaging revealed persistent spinal stenosis due to posterior element hypertrophy several recommended posterior cervical decompression and fusion. I've extensively gone over the risks and benefits of the operation with him as well as perioperative course expectations of outcome and alternatives of surgery and he understood and agreed to proceed forward.  Past Medical History:  Diagnosis Date  . Arthritis   . Complication of anesthesia    spinal ? 2 yrs ago med "went up instead of down and heart stopped for a minute unsure where surgy done"  . GERD (gastroesophageal reflux disease)    occ  tums  . High cholesterol   . Hypertension   . Neuromuscular disorder (HCC)    numbness arms ,legs     Past Surgical History:  Procedure Laterality Date  . ANTERIOR CERVICAL DECOMP/DISCECTOMY FUSION N/A 09/19/2016   Procedure: Cervical four-five anterior cervical decompression fusion with plating;  Surgeon: Donalee Citrinram, Trevis Eden, MD;  [facility];  Service: Neurosurgery;  Laterality: N/A;  . APPENDECTOMY    . JOINT REPLACEMENT    . TOTAL HIP ARTHROPLASTY Bilateral     Family History  Problem Relation Age of Onset  . Cirrhosis Mother   . Cirrhosis Father    Social History:  reports that he has been smoking Cigarettes.  He has been smoking about 0.50 packs per day. He has never used smokeless tobacco. He reports that he does not drink alcohol or use drugs.  Allergies:  Allergies  Allergen Reactions  . Shellfish Allergy Swelling and Other (See Comments)     Half of face swelled    Medications Prior to Admission  Medication Sig Dispense Refill  . amLODipine (NORVASC) 10 MG tablet Take 10 mg by mouth daily.    . pravastatin (PRAVACHOL) 40 MG tablet Take 40 mg by mouth at bedtime.    . cyclobenzaprine (FLEXERIL) 10 MG tablet Take 1 tablet (10 mg total) by mouth 3 (three) times daily as needed for muscle spasms. 50 tablet 1  . hydrochlorothiazide (HYDRODIURIL) 25 MG tablet Take 25 mg by mouth daily.    Marland Kitchen. oxyCODONE (OXY IR/ROXICODONE) 5 MG immediate release tablet Take 1-2 tablets (5-10 mg total) by mouth every 4 (four) hours as needed for breakthrough pain. (Patient not taking: Reported on 12/01/2016) 40 tablet 0    No results found for this or any previous visit (from the past 48 hour(s)). No results found.  Review of Systems  Musculoskeletal: Positive for joint pain and neck pain.  Neurological: Positive for tingling and sensory change.    Blood pressure (!) 184/95, pulse 86, temperature 98.5 F (36.9 C), temperature source Oral, resp. rate 20, SpO2 100 %. Physical Exam  Constitutional: He is oriented to person, place, and time. He appears well-developed and well-nourished.  HENT:  Head: Normocephalic.  Eyes: Pupils are equal, round, and reactive to light.  Neck: Normal range of motion.  GI: Soft. Bowel sounds are normal.  Neurological: He is alert and oriented to person, place, and time. He has normal strength. GCS eye subscore is 4. GCS verbal subscore is 5. GCS motor subscore is 6. **Note De-Identified Hunter Hernandez** Patient is awake alert strength 4+ out of 5 upper extremity is 5 out of 5 in lower extremities hyperreflexic  Skin: Skin is warm and dry.     Assessment/Plan 51 year old presents for posterior cervical decompression fusion C3-4 C4-5 C5-6.  Hunter Hernandez P, MD 12/04/2016, 9:24 AM

## 2016-12-04 NOTE — Transfer of Care (Signed)
**Note De-Identified Kocourek Obfuscation** Immediate Anesthesia Transfer of Care Note  Patient: Fritzi MandesQuentin Rosello  Procedure(s) Performed: Procedure(s): Cervical three-four, Cervical four-five, Cervical five-six Posterior Cervical Laminectomy foraminotomies and Posterolateral Fusion with lateral mass screws (N/A)  Patient Location: PACU  Anesthesia Type:General  Level of Consciousness: awake, alert  and oriented  Airway & Oxygen Therapy: Patient Spontanous Breathing and Patient connected to nasal cannula oxygen  Post-op Assessment: Report given to RN, Post -op Vital signs reviewed and stable and Patient moving all extremities X 4  Post vital signs: Reviewed and stable  Last Vitals:  Vitals:   12/04/16 0602  BP: (!) 184/95  Pulse: 86  Resp: 20  Temp: 36.9 C    Last Pain:  Vitals:   12/04/16 0602  TempSrc: Oral      Patients Stated Pain Goal: 3 (12/04/16 0616)  Complications: No apparent anesthesia complications

## 2016-12-04 NOTE — Op Note (Signed)
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  Surgeon: Jillyn HiddenGary Hanalei Glace  Asst.: Barbaraann BarthelKyle Cabell  Anesthesia: Gen.  EBL: Minimal  History of present illness: Patient is a 51 year old gentleman presented to the hospital with a central cord syndrome and spinal cord injury underwent ACDF C4-C6 initially postop for he made significant improvement with a leveled off with persistent numbness burning and tingling in his fingers. Follow-up imaging showed persistent stenosis due to posterior element hypertrophy from C3-C6 so recommend a posterior cervical decompression and fusion I extensively went over the risks and benefits of the operation the patient as well as perioperative course expectations of outcome and alternatives of surgery and he understands and agrees to proceed forward.  Operative procedure: Patient brought into the or was induced under general anesthesia positioned prone in pins and neck in slight flexion back side his neck was prepped and draped in routine sterile fashion after infiltration 10 mL lidocaine with epi a midline incision was made and Bovie light car was used to calcification subperiosteal dissections care lamina of C3, C4, C5, C6. Interoperative x-ray confirmed medication appropriate level so attention was first taken the lateral mass screw placement in the lateral masses were dissected free and identified pilot holes were drilled in the inferomedial quadrant and then utilizing 12 mm guide the holes were drilled wrecking superior laterally. I then tapped the near cortex and placed 12 mm screws bilaterally at C3, C4, C5, and C6 on the patient's left side. On the right  side that screw deviated in the facet joint and there was no additional the entry point so I left the screw out that level. After all screws were placed distally to decompression so I removed the spinous process of C3-C4 and C5 performed complete decompressive laminectomies marching down the lateral gutters to demise manipulation. Complete central decompression was performed there was marked stenosis predominant at C5-6 at the end decompression was no further stenosis centrally or foraminally. The wounds and to proceed irrigated meticulous hemostasis was maintained aggressive decortication was care and the facet joints and along the lateral masses from C3-C6 I packed the allostem and autograft mix in the facet joints and posterior laterally then I could not fashion and cut 2 rods anchored everything in place laid Gelfoam over the dura placed medium Hemovac drain and closed with layers with interrupted Vicryl and a running 4 septic or and skin Dermabond benzo and Steri-Strips and a sterile dressing was applied patient recovered in stable condition. At the end of case on it counts and sponge counts were correct.

## 2016-12-04 NOTE — Anesthesia Procedure Notes (Signed)
**Note De-Identified Cephas Obfuscation** Procedure Name: Intubation Date/Time: 12/04/2016 9:42 AM Performed by: Mariea Clonts Pre-anesthesia Checklist: Patient identified, Emergency Drugs available, Suction available and Patient being monitored Patient Re-evaluated:Patient Re-evaluated prior to induction Oxygen Delivery Method: Circle System Utilized Preoxygenation: Pre-oxygenation with 100% oxygen Induction Type: IV induction Ventilation: Mask ventilation without difficulty Laryngoscope Size: Mac and 4 Grade View: Grade I Tube type: Oral Tube size: 7.5 mm Number of attempts: 1 Airway Equipment and Method: Stylet and Oral airway Placement Confirmation: ETT inserted through vocal cords under direct vision,  positive ETCO2 and breath sounds checked- equal and bilateral Tube secured with: Tape Dental Injury: Teeth and Oropharynx as per pre-operative assessment

## 2016-12-04 NOTE — Anesthesia Postprocedure Evaluation (Signed)
**Note De-Identified Christopoulos Obfuscation** Anesthesia Post Note  Patient: Hunter Hernandez  Procedure(s) Performed: Procedure(s) (LRB): Cervical three-four, Cervical four-five, Cervical five-six Posterior Cervical Laminectomy foraminotomies and Posterolateral Fusion with lateral mass screws (N/A)     Patient location during evaluation: PACU Anesthesia Type: General Level of consciousness: sedated Pain management: pain level controlled Vital Signs Assessment: post-procedure vital signs reviewed and stable Respiratory status: spontaneous breathing and respiratory function stable Cardiovascular status: stable Anesthetic complications: no    Last Vitals:  Vitals:   12/04/16 1247 12/04/16 1312  BP:    Pulse: (!) 102 100  Resp: 16 11  Temp:      Last Pain:  Vitals:   12/04/16 1312  TempSrc:   PainSc: 5                  Keleigh Kazee DANIEL

## 2016-12-04 NOTE — Evaluation (Signed)
**Note De-Identified Arviso Obfuscation** Physical Therapy Evaluation Patient Details Name: Hunter Hernandez MRN: [id number] DOB: 25-Apr-1966 Today's Date: 12/04/2016   History of Present Illness  Pt is a 51 y/o male s/p C3-6 posterior cervical decompressive laminectomy and fixation. PMH includes HTN, bilat THA, ACDF in May of 2018, and current smoker.   Clinical Impression  Pt is s/p surgery above with deficits below. PTA, pt reports he was using RW, however, family reports he was not. Pt reports he has an aide come in every day for a couple of hours. Upon eval, pt presenting with post op pain, as well as, weakness and decreased balance. Required multiple cues for appropriate use of RW and educated to use consistently at home to increase steadiness. Recommendations below. Will continue to follow acutely to maximize functional mobility independence.     Follow Up Recommendations No PT follow up;Supervision for mobility/OOB    Equipment Recommendations  None recommended by PT (has all DME )    Recommendations for Other Services       Precautions / Restrictions Precautions Precautions: Cervical Precaution Comments: Reviewed cervical precautions with pt.  Required Braces or Orthoses: Cervical Brace Cervical Brace: Soft collar Restrictions Weight Bearing Restrictions: No      Mobility  Bed Mobility Overal bed mobility: Needs Assistance Bed Mobility: Sit to Sidelying;Rolling Rolling: Min guard       Sit to sidelying: Mod assist General bed mobility comments: In chair upon entry. Mod A for LE lift assist for return to sidelying. Verbal cues for log roll technique.   Transfers Overall transfer level: Needs assistance Equipment used: Rolling walker (2 wheeled) Transfers: Sit to/from Stand Sit to Stand: Min assist         General transfer comment: Min A for steadying upon standing. Verbal cues for safe hand placement.   Ambulation/Gait Ambulation/Gait assistance: Min guard Ambulation Distance (Feet): 200 Feet Assistive  device: Rolling walker (2 wheeled) Gait Pattern/deviations: Step-through pattern;Decreased stride length;Trunk flexed Gait velocity: Decreased Gait velocity interpretation: Below normal speed for age/gender General Gait Details: Slow, guarded gait. Slight unsteadiness, however, pt reports balance is better since surgery. Required continuous cues to keep RW with him as he tried to walk away from it when entering bathroom. Educated to use at home to increase stability.   Stairs            Wheelchair Mobility    Modified Rankin (Stroke Patients Only)       Balance Overall balance assessment: Needs assistance Sitting-balance support: No upper extremity supported;Feet supported Sitting balance-Leahy Scale: Good     Standing balance support: Bilateral upper extremity supported;During functional activity Standing balance-Leahy Scale: Poor Standing balance comment: Reliant on RW for stability                              Pertinent Vitals/Pain Pain Assessment: 0-10 Pain Score: 8  Pain Location: neck  Pain Descriptors / Indicators: Aching;Operative site guarding;Sore Pain Intervention(s): Limited activity within patient's tolerance;Monitored during session;Repositioned    Home Living Family/patient expects to be discharged to:: Private residence Living Arrangements: Children Available Help at Discharge: Personal care attendant (2 hrs/ daughter 5 pm through evenings ) Type of Home: House Home Access: Stairs to enter   [employer] of Steps: 1 (threshold step ) Home Layout: 1/2 bath on main level;Bed/bath upstairs;Two level Home Equipment: Walker - 2 wheels;Grab bars - tub/shower;Shower seat;Bedside commode Additional Comments: aide present 2 hours a day for three years **Note De-Identified Goswami Obfuscation** Prior Function Level of Independence: Independent with assistive device(s)         Comments: Reports he used RW at baseline, however, family reports he did not.      Hand  Dominance   Dominant Hand: Left    Extremity/Trunk Assessment   Upper Extremity Assessment Upper Extremity Assessment: Defer to OT evaluation    Lower Extremity Assessment Lower Extremity Assessment: Generalized weakness (sensory in tact )    Cervical / Trunk Assessment Cervical / Trunk Assessment: Other exceptions Cervical / Trunk Exceptions: s/p neck surgery.   Communication   Communication: No difficulties  Cognition Arousal/Alertness: Awake/alert Behavior During Therapy: WFL for tasks assessed/performed Overall Cognitive Status: Within Functional Limits for tasks assessed                                        General Comments General comments (skin integrity, edema, etc.): Pt family present during session.    Exercises     Assessment/Plan    PT Assessment Patient needs continued PT services  PT Problem List Decreased strength;Decreased balance;Decreased mobility;Decreased knowledge of use of DME;Decreased safety awareness;Decreased knowledge of precautions;Pain       PT Treatment Interventions DME instruction;Gait training;Stair training;Functional mobility training;Therapeutic activities;Therapeutic exercise;Neuromuscular re-education;Patient/family education    PT Goals (Current goals can be found in the Care Plan section)  Acute Rehab PT Goals Patient Stated Goal: to go home  PT Goal Formulation: With patient Time For Goal Achievement: 12/11/16 Potential to Achieve Goals: Good    Frequency Min 5X/week   Barriers to discharge        Co-evaluation               AM-PAC PT "6 Clicks" Daily Activity  Outcome Measure Difficulty turning over in bed (including adjusting bedclothes, sheets and blankets)?: Total Difficulty moving from lying on back to sitting on the side of the bed? : Total Difficulty sitting down on and standing up from a chair with arms (e.g., wheelchair, bedside commode, etc,.)?: Total Help needed moving to and from a  bed to chair (including a wheelchair)?: A Little Help needed walking in hospital room?: A Little Help needed climbing 3-5 steps with a railing? : A Lot 6 Click Score: 11    End of Session Equipment Utilized During Treatment: Cervical collar;Gait belt Activity Tolerance: Patient tolerated treatment well Patient left: in bed;with call bell/phone within reach;with family/visitor present Nurse Communication: Mobility status PT Visit Diagnosis: Unsteadiness on feet (R26.81);Pain Pain - part of body:  (neck )    Time: [id number] PT Time Calculation (min) (ACUTE ONLY): 21 min   Charges:   PT Evaluation $PT Eval Low Complexity: 1 Procedure PT Treatments $Gait Training: 8-22 mins   PT G Codes:        Gladys DammeBrittany Zerrick Hanssen, PT, DPT  Acute Rehabilitation Services  Pager: [phone]   Lehman PromBrittany S Lillard Bailon 12/04/2016, 6:54 PM

## 2016-12-05 ENCOUNTER — Encounter (HOSPITAL_COMMUNITY): Payer: Self-pay | Admitting: Neurosurgery

## 2016-12-05 MED ORDER — CYCLOBENZAPRINE HCL 10 MG PO TABS: 10.0000 mg | ORAL_TABLET | Freq: Three times a day (TID) | ORAL | 0 refills | Status: DC | PRN

## 2016-12-05 MED ORDER — OXYCODONE HCL 5 MG PO TABS: 15.0000 mg | ORAL_TABLET | ORAL | 0 refills | Status: DC | PRN

## 2016-12-05 MED ORDER — LABETALOL HCL 200 MG PO TABS: 250.0000 mg | ORAL_TABLET | Freq: Once | ORAL | Status: DC

## 2016-12-05 MED ORDER — LABETALOL HCL 5 MG/ML IV SOLN
10.0000 mg | Freq: Once | INTRAVENOUS | Status: AC
  Administered 2016-12-05: 10 mg via INTRAVENOUS
  Filled 2016-12-05: qty 4

## 2016-12-05 NOTE — Progress Notes (Signed)
**Note De-Identified Schumpert Obfuscation** Physical Therapy Treatment Patient Details Name: Hunter Hernandez ClientQuentin Ostrovsky MRN: [id number] DOB: 03/25/1966 Today's Date: 12/05/2016    History of Present Illness Pt is a 51 y/o male s/p C3-6 posterior cervical decompressive laminectomy and fixation. PMH includes HTN, bilat THA, ACDF in May of 2018, and current smoker.     PT Comments    Pt progressing towards physical therapy goals. Ambulating fairly well with RW, however requires cues for safe management at times. Pt anticipates d/c home today. Will continue to follow and progress as able per POC.    Follow Up Recommendations  No PT follow up;Supervision for mobility/OOB     Equipment Recommendations  None recommended by PT (has all DME )    Recommendations for Other Services       Precautions / Restrictions Precautions Precautions: Cervical Precaution Comments: Reviewed cervical precautions with pt.  Required Braces or Orthoses: Cervical Brace Cervical Brace: Soft collar Restrictions Weight Bearing Restrictions: No    Mobility  Bed Mobility Overal bed mobility: Needs Assistance Bed Mobility: Rolling;Sidelying to Sit Rolling: Supervision Sidelying to sit: Min guard       General bed mobility comments: HOB flat and rails lowered to simulate home environment. Pt was able to transition to EOB with min guard assist and VC's for proper log roll technique.   Transfers Overall transfer level: Needs assistance Equipment used: Rolling walker (2 wheeled) Transfers: Sit to/from Stand Sit to Stand: Min guard         General transfer comment: Close guard for steadying upon standing. Verbal cues for safe hand placement.   Ambulation/Gait Ambulation/Gait assistance: Min guard Ambulation Distance (Feet): 350 Feet Assistive device: Rolling walker (2 wheeled) Gait Pattern/deviations: Step-through pattern;Decreased stride length;Trunk flexed Gait velocity: Decreased Gait velocity interpretation: Below normal speed for age/gender General  Gait Details: Slow, guarded gait. Slight unsteadiness, however, pt reports balance is better since surgery. Required continuous cues to keep RW with him as he tried to walk away from it when entering bathroom. Educated to use at home to increase stability.    Stairs Stairs: Yes   Stair Management: One rail Left;Step to pattern;Sideways Number of Stairs: 10 General stair comments: HHA to ascend, sideways with both UE's on railing for descent.   Wheelchair Mobility    Modified Rankin (Stroke Patients Only)       Balance Overall balance assessment: Needs assistance Sitting-balance support: No upper extremity supported;Feet supported Sitting balance-Leahy Scale: Good     Standing balance support: Bilateral upper extremity supported;During functional activity Standing balance-Leahy Scale: Poor Standing balance comment: Reliant on RW for stability                             Cognition Arousal/Alertness: Awake/alert Behavior During Therapy: WFL for tasks assessed/performed Overall Cognitive Status: Within Functional Limits for tasks assessed                                        Exercises      General Comments        Pertinent Vitals/Pain Pain Assessment: 0-10 Pain Score: 8  Pain Location: neck  Pain Descriptors / Indicators: Aching;Operative site guarding;Sore Pain Intervention(s): Limited activity within patient's tolerance;Monitored during session;Repositioned    Home Living                      Prior Function **Note De-Identified Remigio Obfuscation** PT Goals (current goals can now be found in the care plan section) Acute Rehab PT Goals Patient Stated Goal: to go home  PT Goal Formulation: With patient Time For Goal Achievement: 12/11/16 Potential to Achieve Goals: Good Progress towards PT goals: Progressing toward goals    Frequency    Min 5X/week      PT Plan Current plan remains appropriate    Co-evaluation              AM-PAC PT  "6 Clicks" Daily Activity  Outcome Measure  Difficulty turning over in bed (including adjusting bedclothes, sheets and blankets)?: A Little Difficulty moving from lying on back to sitting on the side of the bed? : A Little Difficulty sitting down on and standing up from a chair with arms (e.g., wheelchair, bedside commode, etc,.)?: A Little Help needed moving to and from a bed to chair (including a wheelchair)?: A Little Help needed walking in hospital room?: A Little Help needed climbing 3-5 steps with a railing? : A Little 6 Click Score: 18    End of Session Equipment Utilized During Treatment: Cervical collar;Gait belt Activity Tolerance: Patient tolerated treatment well Patient left: in bed;with call bell/phone within reach;with family/visitor present Nurse Communication: Mobility status PT Visit Diagnosis: Unsteadiness on feet (R26.81);Pain Pain - part of body:  (neck )     Time: 4540-9811 PT Time Calculation (min) (ACUTE ONLY): 16 min  Charges:  $Gait Training: 8-22 mins                    G Codes:       Conni Slipper, PT, DPT Acute Rehabilitation Services Pager: [phone]    Marylynn Pearson 12/05/2016, 9:41 AM

## 2016-12-05 NOTE — Evaluation (Signed)
**Note De-Identified Hunter Hernandez** Occupational Therapy Evaluation and Discharge Patient Details Name: Sinclair Alligood MRN: [id number] DOB: 09/26/1965 Today's Date: 12/05/2016    History of Present Illness Pt is a 51 y/o male s/p C3-6 posterior cervical decompressive laminectomy and fixation. PMH includes HTN, bilat THA, ACDF in May of 2018, and current smoker.    Clinical Impression   This 51 yo male admitted and underwent above presents to acute OT with all education completed,we will D/C from acute OT.    Follow Up Recommendations  No OT follow up;Supervision/Assistance - 24 hour    Equipment Recommendations  None recommended by OT       Precautions / Restrictions Precautions Precautions: Cervical Precaution Comments: Reviewed cervical precautions with pt.  Required Braces or Orthoses: Cervical Brace Cervical Brace: Soft collar (can have collar off for dressing, showering, eating, mouth care) Restrictions Weight Bearing Restrictions: No      Mobility Bed Mobility      General bed mobility comments: Pt sitting EOB upon my arrival  Transfers Overall transfer level: Needs assistance Equipment used: Rolling walker (2 wheeled) Transfers: Sit to/from Stand Sit to Stand: Min guard            Balance Overall balance assessment: Needs assistance Sitting-balance support: No upper extremity supported;Feet supported Sitting balance-Leahy Scale: Good     Standing balance support: Bilateral upper extremity supported;During functional activity Standing balance-Leahy Scale: Poor Standing balance comment: Reliant on RW for stability                            ADL either performed or assessed with clinical judgement   ADL                                         General ADL Comments: pt uses sock aid and reacher for LBADLs prn. He was educated on when he could have soft collar off for basic ADLs, he is min guard A for any time he is up on his feet.     Vision Patient Visual  Report: No change from baseline              Pertinent Vitals/Pain Pain Assessment: 0-10 Pain Score: 8  Pain Location: neck  Pain Descriptors / Indicators: Aching;Operative site guarding;Sore Pain Intervention(s): Limited activity within patient's tolerance;Monitored during session;Repositioned     Hand Dominance Left   Extremity/Trunk Assessment Upper Extremity Assessment Upper Extremity Assessment: Overall WFL for tasks assessed (reports tingling is now gone post surgery)           Communication Communication Communication: No difficulties   Cognition Arousal/Alertness: Awake/alert Behavior During Therapy: WFL for tasks assessed/performed Overall Cognitive Status: Within Functional Limits for tasks assessed                                                Home Living Family/patient expects to be discharged to:: Private residence Living Arrangements: Children Available Help at Discharge: Personal care attendant (2 hours, dtr there 5pm through evening) Type of Home: House Home Access: Stairs to enter [employer] of Steps: 1 (threshold step)   Home Layout: 1/2 bath on main level;Bed/bath upstairs;Two level Alternate Level Stairs-Number of Steps: 12-15 Alternate Level Stairs-Rails: Left Bathroom Shower/Tub: Tub/shower unit **Note De-Identified Kushner Hernandez** Bathroom Toilet: Standard     Home Equipment: Walker - 2 wheels;Grab bars - tub/shower;Shower seat;Bedside commode;Adaptive equipment Adaptive Equipment: Reacher;Sock aid Additional Comments: aide present 2 hours a day for three years      Prior Functioning/Environment Level of Independence: Independent with assistive device(s)        Comments: Reports he used RW at baseline, however, family reports he did not.         OT Problem List: Impaired balance (sitting and/or standing);Pain         OT Goals(Current goals can be found in the care plan section) Acute Rehab OT Goals Patient Stated Goal: to go home  today  OT Frequency:                AM-PAC PT "6 Clicks" Daily Activity     Outcome Measure Help from another person eating meals?: None Help from another person taking care of personal grooming?: None Help from another person toileting, which includes using toliet, bedpan, or urinal?: A Little Help from another person bathing (including washing, rinsing, drying)?: A Little Help from another person to put on and taking off regular upper body clothing?: A Little Help from another person to put on and taking off regular lower body clothing?: A Little 6 Click Score: 20   End of Session Equipment Utilized During Treatment: Cervical collar Nurse Communication:  (no further OT needs)  Activity Tolerance: Patient tolerated treatment well Patient left: with family/visitor present (sitting EOB eating breakfast)  OT Visit Diagnosis: Unsteadiness on feet (R26.81);Pain Pain - part of body:  (neck)                Time: [id number] OT Time Calculation (min): 19 min Charges:  OT General Charges $OT Visit: 1 Procedure OT Evaluation $OT Eval Moderate Complexity: 1 Procedure Ignacia PalmaCathy Berlin Viereck, OTR/L [phone] 12/05/2016

## 2016-12-05 NOTE — Progress Notes (Addendum)
**Note De-Identified Mathieson Obfuscation** Patient alert and oriented, mae's well, voiding adequate amount of urine, swallowing without difficulty,  c/o pain and medication given. Patient discharged home with family. Script and discharged instructions given to patient. Patient and family stated understanding of d/c instructions given and has an appointment with MD.

## 2016-12-05 NOTE — Discharge Summary (Signed)
**Note De-identified [place] Obfuscation**  **Note De-Identified Gebhart Obfuscation** Physician Discharge Summary  Patient ID: Alfredia ClientQuentin Kadlec MRN: [id number] DOB/AGE: 49/09/1965 49 y.o.  Admit date: 12/04/2016 Discharge date: 12/05/2016  Admission Diagnoses:Cervical spondylitic myelopathy  Discharge Diagnoses: Same Active Problems:   Myelopathy [facility](HCC)   Discharged Condition: good  Hospital Course: Patient admitted hospital underwent posterior cervical decompression fusion postoperatively was done very well ambulating and voiding spontaneously pains well-controlled patient stable for discharge home scheduled follow-up in a week and a half.  Consults: Significant Diagnostic Studies: Treatments: Decompressive cervical laminectomy and fusion C3-C6 Discharge Exam: Blood pressure (!) 154/87, pulse 97, temperature 98.5 F (36.9 C), resp. rate 16, SpO2 99 %. Strength out of 5 wound clean dry and intact  Disposition: Home   Allergies as of 12/05/2016      Reactions   Shellfish Allergy Swelling, Other (See Comments)   Half of face swelled      Medication List    TAKE these medications   amLODipine 10 MG tablet Commonly known as:  NORVASC Take 10 mg by mouth daily.   cyclobenzaprine 10 MG tablet Commonly known as:  FLEXERIL Take 1 tablet (10 mg total) by mouth 3 (three) times daily as needed for muscle spasms. What changed:  Another medication with the same name was added. Make sure you understand how and when to take each.   cyclobenzaprine 10 MG tablet Commonly known as:  FLEXERIL Take 1 tablet (10 mg total) by mouth 3 (three) times daily as needed for muscle spasms. What changed:  You were already taking a medication with the same name, and this prescription was added. Make sure you understand how and when to take each.   hydrochlorothiazide 25 MG tablet Commonly known as:  HYDRODIURIL Take 25 mg by mouth daily.   oxyCODONE 5 MG immediate release tablet Commonly known as:  Oxy IR/ROXICODONE Take 1-2 tablets (5-10 mg total) by mouth every 4 (four) hours as  needed for breakthrough pain. What changed:  Another medication with the same name was added. Make sure you understand how and when to take each.   oxyCODONE 5 MG immediate release tablet Commonly known as:  Oxy IR/ROXICODONE Take 3 tablets (15 mg total) by mouth every 3 (three) hours as needed for breakthrough pain. What changed:  You were already taking a medication with the same name, and this prescription was added. Make sure you understand how and when to take each.   pravastatin 40 MG tablet Commonly known as:  PRAVACHOL Take 40 mg by mouth at bedtime.        Signed: Jacklynn Dehaas P 12/05/2016, 8:46 AM

## 2016-12-05 NOTE — Discharge Instructions (Signed)
**Note De-identified Tash Obfuscation** Wound Care ° °Keep the incision clean and dry remove the outer dressing in 2 days, leave the Steri-Strips intact. Wrap with Saran wrap for showers only °Do not put any creams, lotions, or ointments on incision. °Leave steri-strips on neck.  They will fall off by themselves. ° °Activity °Walk each and every day, increasing distance each day. °No lifting greater than 5 lbs.  Avoid excessive neck motion. °No lifting no bending no twisting no driving or riding a car unless coming back and forth to see me. °Wear neck brace at all times except when showering.  ° °Diet °Resume your normal diet.  ° °Return to Work °Will be discussed at you follow up appointment. ° °Call Your Doctor If Any of These Occur °Redness, drainage, or swelling at the wound.  °Temperature greater than 101 degrees. °Severe pain not relieved by pain medication. °Incision starts to come apart. °Follow Up Appt °Call today for appointment in 1-2 weeks ([phone]) or for problems.  If you have any hardware placed in your spine, you will need an x-ray before your appointment. ° ° °

## 2017-03-04 ENCOUNTER — Other Ambulatory Visit: Payer: Self-pay | Admitting: Neurosurgery

## 2017-03-04 DIAGNOSIS — G9589 Other specified diseases of spinal cord: Secondary | ICD-10-CM

## 2017-03-30 ENCOUNTER — Other Ambulatory Visit: Payer: Self-pay

## 2017-09-15 ENCOUNTER — Encounter (HOSPITAL_COMMUNITY): Payer: Self-pay

## 2017-09-15 ENCOUNTER — Other Ambulatory Visit: Payer: Self-pay

## 2017-09-15 ENCOUNTER — Emergency Department (HOSPITAL_COMMUNITY)
Admission: EM | Admit: 2017-09-15 | Discharge: 2017-09-15 | Disposition: A | Discharge status: Home / Self Care | Payer: Medicaid Other | Attending: Emergency Medicine | Admitting: Emergency Medicine

## 2017-09-15 DIAGNOSIS — F17210 Nicotine dependence, cigarettes, uncomplicated: Secondary | ICD-10-CM | POA: Insufficient documentation

## 2017-09-15 DIAGNOSIS — I10 Essential (primary) hypertension: Secondary | ICD-10-CM | POA: Diagnosis not present

## 2017-09-15 DIAGNOSIS — Z79899 Other long term (current) drug therapy: Secondary | ICD-10-CM | POA: Insufficient documentation

## 2017-09-15 DIAGNOSIS — R202 Paresthesia of skin: Secondary | ICD-10-CM | POA: Diagnosis not present

## 2017-09-15 DIAGNOSIS — Z96643 Presence of artificial hip joint, bilateral: Secondary | ICD-10-CM | POA: Insufficient documentation

## 2017-09-15 MED ORDER — HYDROCHLOROTHIAZIDE 25 MG PO TABS
25.0000 mg | ORAL_TABLET | Freq: Every day | ORAL | Status: DC
  Administered 2017-09-15: 25 mg via ORAL
  Filled 2017-09-15: qty 1

## 2017-09-15 NOTE — ED Notes (Signed)
**Note De-Identified Hunter Hernandez** Put patient in the hallway c7 patient is resting

## 2017-09-15 NOTE — Discharge Instructions (Addendum)
**Note De-Identified Wisner Obfuscation** Follow-up with a primary care provider/or the neurologist for further assessment and management of this issue.  An outpatient MRI would be indicated. Please be sure to take your home metoprolol and amlodipine.

## 2017-09-15 NOTE — ED Provider Notes (Signed)
**Note De-Identified Miyasaki Obfuscation** [facility] EMERGENCY DEPARTMENT Provider Note   CSN: [id number] Arrival date & time: 09/15/17  1001     History   Chief Complaint Chief Complaint  Patient presents with  . Tingling    HPI Hunter Hernandez is a 52 y.o. male.  HPI   Hunter Hernandez is a 52 y.o. male, with a history of cervical spine surgery, numbness in arms and legs, and HTN, presenting to the ED with tingling "all over" since his cervical spine surgery, July 2018.  Also endorses erectile dysfunction since that time.  States there have been no changes in any of these complaints.  He followed up with his surgeon for postop visits, as instructed, states he notified the surgeon of these issues, and states he was told these issues would resolve "once the swelling went away."  Denies fever/chills, N/V/C/D, weakness, numbness, falls/trauma, neck/back pain, facial droop, difficulty swallowing, changes in bowel or bladder function, or any other complaints.  Patient underwent posterior cervical laminectomy and posterior lateral fusion to C3-C4, C4-C5, and C5-C6 on December 04, 2016 performed by Dr. Wynetta Emery.   Past Medical History:  Diagnosis Date  . Arthritis   . Complication of anesthesia    spinal ? 2 yrs ago med "went up instead of down and heart stopped for a minute unsure where surgy done"  . GERD (gastroesophageal reflux disease)    occ  tums  . High cholesterol   . Hypertension   . Neuromuscular disorder (HCC)    numbness arms ,legs     Patient Active Problem List   Diagnosis Date Noted  . Myelopathy (HCC) 09/19/2016    Past Surgical History:  Procedure Laterality Date  . ANTERIOR CERVICAL DECOMP/DISCECTOMY FUSION N/A 09/19/2016   Procedure: Cervical four-five anterior cervical decompression fusion with plating;  Surgeon: Donalee Citrin, MD;  Location: [facility] OR;  Service: Neurosurgery;  Laterality: N/A;  . APPENDECTOMY    . JOINT REPLACEMENT    . POSTERIOR CERVICAL FUSION/FORAMINOTOMY N/A 12/04/2016   Procedure: Cervical three-four, Cervical four-five, Cervical five-six Posterior Cervical Laminectomy foraminotomies and Posterolateral Fusion with lateral mass screws;  Surgeon: Donalee Citrin, MD;  Location: [facility] OR;  Service: Neurosurgery;  Laterality: N/A;  . TOTAL HIP ARTHROPLASTY Bilateral         Home Medications    Prior to Admission medications   Medication Sig Start Date End Date Taking? Authorizing Provider  amLODipine (NORVASC) 10 MG tablet Take 10 mg by mouth daily.   Yes [provider]  Cholecalciferol (VITAMIN D3) 2000 units TABS Take 2,000 Units by mouth daily.   Yes [provider]  hydrochlorothiazide (HYDRODIURIL) 25 MG tablet Take 25 mg by mouth daily.   Yes [provider]  methocarbamol (ROBAXIN) 500 MG tablet Take 1,000 mg by mouth 4 (four) times daily. 08/18/17  Yes [provider]  metoprolol tartrate (LOPRESSOR) 25 MG tablet Take 25 mg by mouth 2 (two) times daily. 07/22/17  Yes [provider]  pravastatin (PRAVACHOL) 40 MG tablet Take 40 mg by mouth at bedtime.   Yes [provider]  cyclobenzaprine (FLEXERIL) 10 MG tablet Take 1 tablet (10 mg total) by mouth 3 (three) times daily as needed for muscle spasms. Patient not taking: Reported on 09/15/2017 09/21/16   Tressie Stalker, MD  cyclobenzaprine (FLEXERIL) 10 MG tablet Take 1 tablet (10 mg total) by mouth 3 (three) times daily as needed for muscle spasms. Patient not taking: Reported on 09/15/2017 12/05/16   Donalee Citrin, MD  oxyCODONE (OXY **Note De-Identified Coward Obfuscation** IR/ROXICODONE) 5 MG immediate release tablet Take 1-2 tablets (5-10 mg total) by mouth every 4 (four) hours as needed for breakthrough pain. Patient not taking: Reported on 12/01/2016 09/21/16   Tressie Stalker, MD  oxyCODONE (OXY IR/ROXICODONE) 5 MG immediate release tablet Take 3 tablets (15 mg total) by mouth every 3 (three) hours as needed for breakthrough pain. Patient not taking: Reported on 09/15/2017 12/05/16   Donalee Citrin, MD     Family History Family History  Problem Relation Age of Onset  . Cirrhosis Mother   . Cirrhosis Father     Social History Social History   Tobacco Use  . Smoking status: Current Every Day Smoker    Packs/day: 0.50    Types: Cigarettes  . Smokeless tobacco: Never Used  . Tobacco comment: quit 2 weeks  Substance Use Topics  . Alcohol use: No  . Drug use: No     Allergies   Shellfish allergy   Review of Systems Review of Systems  Constitutional: Negative for chills, diaphoresis and fever.  Respiratory: Negative for shortness of breath.   Cardiovascular: Negative for chest pain.  Gastrointestinal: Negative for abdominal pain, diarrhea, nausea and vomiting.  Genitourinary: Negative for difficulty urinating.  Musculoskeletal: Negative for back pain and neck pain.  Neurological: Negative for syncope, facial asymmetry, speech difficulty, weakness, numbness and headaches.       Tingling "all over"  All other systems reviewed and are negative.    Physical Exam Updated Vital Signs BP (!) 181/108 (BP Location: Right Arm)   Pulse 96   Temp 97.9 F (36.6 C) (Oral)   Resp 16   SpO2 97%   Physical Exam  Constitutional: He appears well-developed and well-nourished. No distress.  HENT:  Head: Normocephalic and atraumatic.  Eyes: Conjunctivae are normal.  Neck: Neck supple.  Cardiovascular: Normal rate, regular rhythm, normal heart sounds and intact distal pulses.  Pulmonary/Chest: Effort normal and breath sounds normal. No respiratory distress.  Abdominal: Soft. There is no tenderness. There is no guarding.  Musculoskeletal: He exhibits no edema.  Lymphadenopathy:    He has no cervical adenopathy.  Neurological: He is alert.  Reflex Scores:      Tricep reflexes are 2+ on the right side and 2+ on the left side.      Patellar reflexes are 2+ on the right side and 2+ on the left side. No sensory deficits.  No noted speech deficits. No aphasia. Patient handles oral  secretions without difficulty. No noted swallowing defects.  Equal grip strength bilaterally. Strength 5/5 in the upper extremities. Strength 5/5 with flexion and extension of the hips, knees, and ankles bilaterally.  Patellar DTRs 2+ bilaterally. Patient walks with walker at baseline due to chronic hip pain.  He ambulates steadily without additional assistance. Coordination intact including heel to shin and finger to nose.  Cranial nerves III-XII grossly intact.  No facial droop.   Skin: Skin is warm and dry. He is not diaphoretic.  Psychiatric: He has a normal mood and affect. His behavior is normal.  Nursing note and vitals reviewed.    ED Treatments / Results  Labs (all labs ordered are listed, but only abnormal results are displayed) Labs Reviewed - No data to display  EKG None  Radiology No results found.  Procedures Procedures (including critical care time)  Medications Ordered in ED Medications  hydrochlorothiazide (HYDRODIURIL) tablet 25 mg (25 mg Oral Given 09/15/17 1620)     Initial Impression / Assessment and Plan / ED Course **Note De-Identified Walraven Obfuscation** I have reviewed the triage vital signs and the nursing notes.  Pertinent labs & imaging results that were available during my care of the patient were reviewed by me and considered in my medical decision making (see chart for details).  Clinical Course as of Sep 15 1640  Tue Sep 15, 2017  1520 Patient states he has not taken his blood pressure medications this morning.  He is currently asymptomatic to this.  BP(!): 181/108 [SJ]  1642 This BP was taken prior to patient leaving the department. I was not notified of this finding.   BP(!): 197/117 [SJ]    Clinical Course User Index [SJ] Joy, Shawn C, PA-C    Patient presents with global tingling for several months.  No noted neuro deficits on exam.  He will likely need an MRI, however, does not have findings that would require emergent MRI.  He was advised to follow-up with PCP and  neurology.  Return precautions discussed.  Patient voices understanding of all instructions and is comfortable discharge.      Final Clinical Impressions(s) / ED Diagnoses   Final diagnoses:  Tingling    ED Discharge Orders    None       Concepcion Living 09/15/17 1645    Mesner, Barbara Cower, MD 09/17/17 [phone]

## 2017-09-15 NOTE — ED Notes (Signed)
**Note De-Identified Maskell Obfuscation** Pt verbalizes understanding of d/c instructions. Pt ambulatory with walker at d/c with all belongings.

## 2017-09-15 NOTE — ED Triage Notes (Signed)
**Note De-Identified Bickhart Obfuscation** Pt presents for evaluation of tingling all over body x 1 year. States had cervical spine sx 1.5 year ago and has had this problem since then. Pt is ambulatory. Pt reports issues with erectile dysfunction. Denies loss of bowel or bladder.

## 2017-09-15 NOTE — ED Notes (Addendum)
**Note De-Identified Thies Obfuscation** Patient reports feeling tingling "all over". He states that he had  surgery a year ago and he is here to see if he has "pinched nerve" Denies pain A&O X4

## 2017-11-06 ENCOUNTER — Other Ambulatory Visit: Payer: Self-pay | Admitting: Student

## 2017-11-06 DIAGNOSIS — R200 Anesthesia of skin: Secondary | ICD-10-CM

## 2017-11-06 DIAGNOSIS — R202 Paresthesia of skin: Principal | ICD-10-CM

## 2017-11-16 ENCOUNTER — Ambulatory Visit
Admission: RE | Admit: 2017-11-16 | Discharge: 2017-11-16 | Disposition: A | Discharge status: Home / Self Care | Payer: Medicaid Other | Source: Ambulatory Visit | Attending: Student | Admitting: Student

## 2017-11-16 DIAGNOSIS — R202 Paresthesia of skin: Principal | ICD-10-CM

## 2017-11-16 DIAGNOSIS — R200 Anesthesia of skin: Secondary | ICD-10-CM

## 2017-11-16 MED ORDER — ONDANSETRON HCL 4 MG/2ML IJ SOLN: 4.0000 mg | Freq: Four times a day (QID) | INTRAMUSCULAR | Status: DC | PRN

## 2017-11-16 MED ORDER — IOPAMIDOL (ISOVUE-M 300) INJECTION 61%
10.0000 mL | Freq: Once | INTRAMUSCULAR | Status: AC | PRN
  Administered 2017-11-16: 10 mL via INTRATHECAL

## 2017-11-16 MED ORDER — DIAZEPAM 5 MG PO TABS
10.0000 mg | ORAL_TABLET | Freq: Once | ORAL | Status: AC
  Administered 2017-11-16: 10 mg via ORAL

## 2017-11-16 NOTE — Discharge Instructions (Signed)
**Note De-Identified Issac Obfuscation** Myelogram Discharge Instructions  1. Go home and rest quietly for the next 24 hours.  It is important to lie flat for the next 24 hours.  Get up only to go to the restroom.  You may lie in the bed or on a couch on your back, your stomach, your left side or your right side.  You may have one pillow under your head.  You may have pillows between your knees while you are on your side or under your knees while you are on your back.  2. DO NOT drive today.  Recline the seat as far back as it will go, while still wearing your seat belt, on the way home.  3. You may get up to go to the bathroom as needed.  You may sit up for 10 minutes to eat.  You may resume your normal diet and medications unless otherwise indicated.  Drink lots of extra fluids today and tomorrow.  4. The incidence of headache, nausea, or vomiting is about 5% (one in 20 patients).  If you develop a headache, lie flat and drink plenty of fluids until the headache goes away.  Caffeinated beverages may be helpful.  If you develop severe nausea and vomiting or a headache that does not go away with flat bed rest, call [phone].  5. You may resume normal activities after your 24 hours of bed rest is over; however, do not exert yourself strongly or do any heavy lifting tomorrow. If when you get up you have a headache when standing, go back to bed and force fluids for another 24 hours.  6. Call your physician for a follow-up appointment.  The results of your myelogram will be sent directly to your physician by the following day.  7. If you have any questions or if complications develop after you arrive home, please call [phone].  Discharge instructions have been explained to the patient.  The patient, or the person responsible for the patient, fully understands these instructions.  YOU MAY RESTART YOUR ASPIRIN TODAY AS NEEDED.

## 2017-11-27 ENCOUNTER — Encounter: Payer: Self-pay | Admitting: Family Medicine

## 2017-11-27 ENCOUNTER — Ambulatory Visit: Payer: Medicaid Other | Admitting: Family Medicine

## 2017-11-27 DIAGNOSIS — K219 Gastro-esophageal reflux disease without esophagitis: Secondary | ICD-10-CM

## 2017-11-27 DIAGNOSIS — E7800 Pure hypercholesterolemia, unspecified: Secondary | ICD-10-CM

## 2017-11-27 DIAGNOSIS — F1911 Other psychoactive substance abuse, in remission: Secondary | ICD-10-CM | POA: Insufficient documentation

## 2017-11-27 DIAGNOSIS — Z0000 Encounter for general adult medical examination without abnormal findings: Secondary | ICD-10-CM

## 2017-11-27 DIAGNOSIS — Z87898 Personal history of other specified conditions: Secondary | ICD-10-CM | POA: Diagnosis not present

## 2017-11-27 DIAGNOSIS — I159 Secondary hypertension, unspecified: Secondary | ICD-10-CM | POA: Diagnosis not present

## 2017-11-27 DIAGNOSIS — I10 Essential (primary) hypertension: Secondary | ICD-10-CM | POA: Insufficient documentation

## 2017-11-27 NOTE — Patient Instructions (Signed)
**Note De-Identified Stivers Obfuscation** It was a pleasure seeing you today.   Today we discussed your new patient appointment  Now that you are established here you can schedule appointments for any medical problem. If I am not available please schedule with another provider.   Please follow up for blood pressure check or sooner if symptoms persist or worsen. Please call the clinic immediately if you have any concerns.   Our clinic's number is [phone]. Please call with questions or concerns.    Thank you,  Oralia ManisSherin Aubreigh Fuerte, DO

## 2017-11-27 NOTE — Assessment & Plan Note (Addendum)
**Note De-Identified Gabrys Obfuscation** Patient presents today to establish care.  Did not want to discuss any other problems at this time.  Patient is due for colonoscopy but refusing at this time.  Stated he would go home and think about it and let us know when he wants a colonoscopy.  Release of records form given to patient to request records from previous physicians.

## 2017-11-27 NOTE — Progress Notes (Addendum)
**Note De-Identified Bankert Obfuscation** Subjective:    Patient ID: Hunter Hernandez, male    DOB: 1966-03-24, 52 y.o.   MRN: [id number]   CC: New Patient  HPI: PMHx: Past Medical History:  Diagnosis Date  . Arthritis   . Chronic pain   . Complication of anesthesia    spinal ? 2 yrs ago med "went up instead of down and heart stopped for a minute unsure where surgy done"  . GERD (gastroesophageal reflux disease)    occ  tums  . High cholesterol   . Hypertension   . Irregular heart beat   . Neuromuscular disorder (HCC)    numbness arms ,legs   . Sleep apnea      Surgical Hx: Past Surgical History:  Procedure Laterality Date  . ANTERIOR CERVICAL DECOMP/DISCECTOMY FUSION N/A 09/19/2016   Procedure: Cervical four-five anterior cervical decompression fusion with plating;  Surgeon: Donalee Citrin, MD;  Location: [facility] OR;  Service: Neurosurgery;  Laterality: N/A;  . APPENDECTOMY  2016  . JOINT REPLACEMENT    . POSTERIOR CERVICAL FUSION/FORAMINOTOMY N/A 12/04/2016   Procedure: Cervical three-four, Cervical four-five, Cervical five-six Posterior Cervical Laminectomy foraminotomies and Posterolateral Fusion with lateral mass screws;  Surgeon: Donalee Citrin, MD;  Location: [facility] OR;  Service: Neurosurgery;  Laterality: N/A;  . TOTAL HIP ARTHROPLASTY Bilateral 2016     Family Hx: Family History  Problem Relation Age of Onset  . Cirrhosis Mother   . Alcohol abuse Mother   . Cirrhosis Father   . Alcohol abuse Father   . Hypertension Paternal Grandmother      Social Hx: Who lives at home: daughter. Planning on moving soon in September 11/27/2017  Who would speak for you about health care matters: daugther 11/27/2017  Transportation: daughter drives or calls reserve a ride scat 11/27/2017 Important Relationships & Pets: none 11/27/2017  Current Stressors: needs to move 11/27/2017 Work / Education:  On disability, highest education is 8th/9th grade 11/27/2017 Religious / Personal Beliefs: none 11/27/2017 Interests / Fun: camping, fishing,  singing, going to movies, bowling 11/27/2017 Other: Drinks alcohol only on his birthday, Christmas, and [place].  Drinks 1 pint liquor during the holidays.  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 Former smoker, quit 1 month ago.  Current every day E cigarette smoker 11/27/2017  Medications: norvasc 10mg  daily Cholecalciferol 2000 units daily Hydrochlorothiazide 25 mg daily Profen as needed Methocarbamol 1000 mg 4 times a day Metoprolol tartrate 25 mg twice daily Pravastatin 40 mg at bedtime  ROS: Man:  Patient reports no  vision/ hearing changes,anorexia, weight change, fever ,adenopathy, persistant / recurrent hoarseness, swallowing issues, chest pain, edema,persistant / recurrent cough, hemoptysis, dyspnea(rest, exertional, paroxysmal nocturnal), gastrointestinal  bleeding (melena, rectal bleeding), abdominal pain, excessive heart burn, GU symptoms(dysuria, hematuria, pyuria, voiding/incontinence  Issues) syncope, focal weakness, severe memory loss, concerning skin lesions, depression, anxiety, abnormal bruising/bleeding, major joint swelling.    Preventative Screening Colonoscopy: not done yet  PSA: Not done Tetanus vaccine: can't remember but within 10 years Pneumonia vaccine: none Shingles vaccine: none Heart stress test: 4-5 years ago Echocardiogram: none Xrays: neck in last 3-4 months CT/MRI: in last 3-4 months  Objective:  BP (!) 150/80   Pulse 82   Temp 98.3 F (36.8 C) (Oral)   Wt 223 lb (101.2 kg)   SpO2 98%   BMI 37.11 kg/m  Vitals and nursing note reviewed  General: well nourished, in no acute distress HEENT: normocephalic, PERRL, EOMI, no scleral icterus or **Note De-Identified Piechocki Obfuscation** conjunctival pallor, no nasal discharge, moist mucous membranes, without erythema or discharge noted in posterior oropharynx Neck: supple, non-tender, without lymphadenopathy Cardiac: RRR, clear S1 and S2, no murmurs, rubs, or gallops Respiratory: clear to  auscultation bilaterally, no increased work of breathing Abdomen: soft, nontender, nondistended, no masses or organomegaly. Bowel sounds present Extremities: no edema or cyanosis. Warm, well perfused. 2+ radial pulses bilaterally Skin: warm and dry, no rashes noted Neuro: alert and oriented, no focal deficits   Assessment & Plan:    Healthcare maintenance Patient presents today to establish care.  Did not want to discuss any other problems at this time.  Patient is due for colonoscopy but refusing at this time.  Stated he would go home and think about it and let us know when he wants a colonoscopy.  Release of records form given to patient to request records from previous physicians.  Hypertension Patient with elevated blood pressure today of 150/80.  Patient states that he is normally well controlled but he is very stressed out at the moment as he is being forced to move out of his home by September and cannot find any other housing at this time.  Patient agreeable to follow-up for hypertension management.    Return for HTN management.   Oralia ManisSherin Myreon Wimer, DO, PGY-1

## 2017-11-27 NOTE — Assessment & Plan Note (Signed)
**Note De-Identified Peruski Obfuscation** Patient with elevated blood pressure today of 150/80.  Patient states that he is normally well controlled but he is very stressed out at the moment as he is being forced to move out of his home by September and cannot find any other housing at this time.  Patient agreeable to follow-up for hypertension management.

## 2017-12-17 ENCOUNTER — Encounter: Payer: Self-pay | Admitting: Neurology

## 2018-03-19 ENCOUNTER — Encounter: Payer: Self-pay | Admitting: Neurology

## 2018-03-22 ENCOUNTER — Ambulatory Visit: Payer: Medicaid Other | Admitting: Neurology

## 2018-04-30 IMAGING — MR MR CERVICAL SPINE W/O CM
4 of 5 series · 18 of 48 positions shown · non-contrast
Comparison: None.

CLINICAL DATA: Patient reports that he had pain of the neck 3 weeks
ago and was given Naproxen. Patient states he is now having tingling
of bilateral arms and numbness and tingling of both legs. Patient
c/o difficulty walking due to the numbness.

EXAM:
MRI CERVICAL SPINE WITHOUT CONTRAST
TECHNIQUE: Multiplanar, multisequence MR imaging of the cervical spine was
performed. No intravenous contrast was administered.

[Series 2: T1 · sagittal · 3.0mm · 0.41mm/px · 3 of 12 slices shown]
[im 3/12]
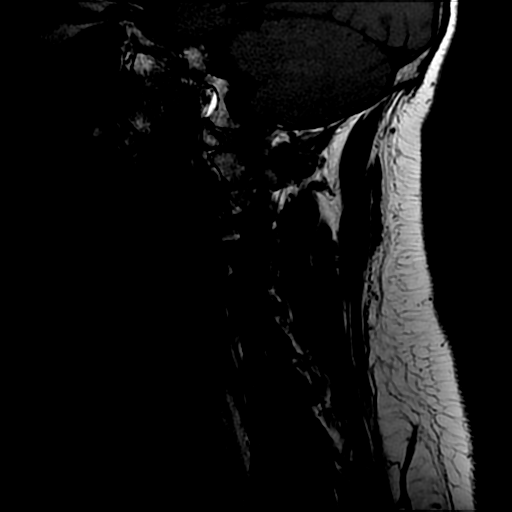
[im 7/12]
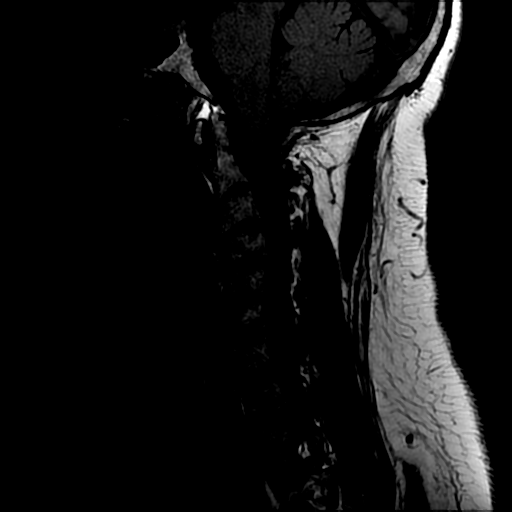
[im 12/12]
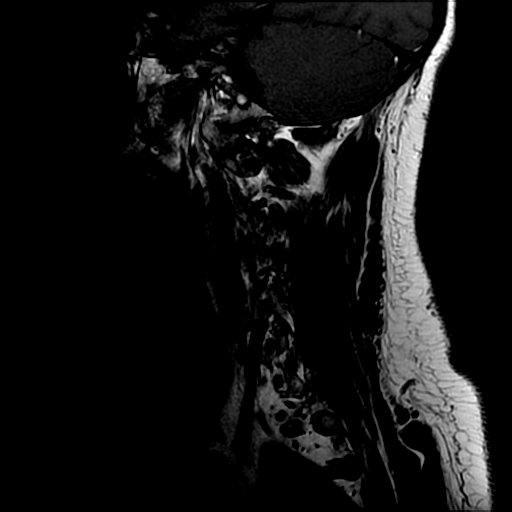

[Series 3: sag ir · sagittal · 3.0mm · 0.41mm/px · 3 of 12 slices shown]
[im 2/12]
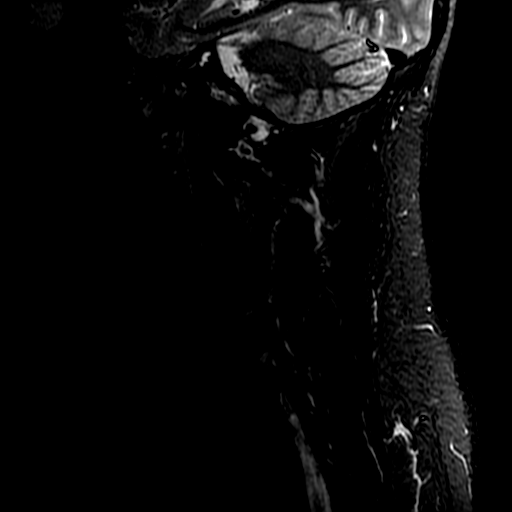
[im 6/12]
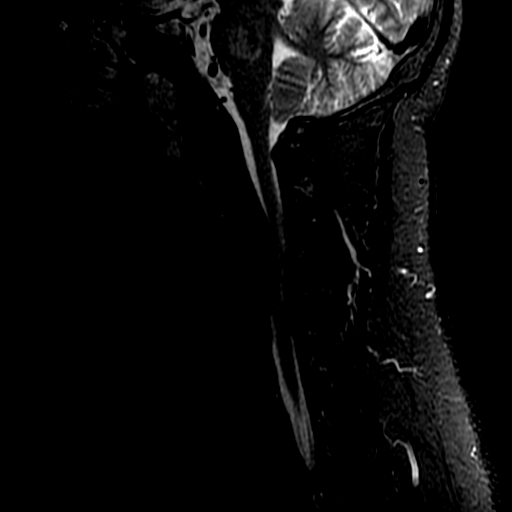
[im 10/12]
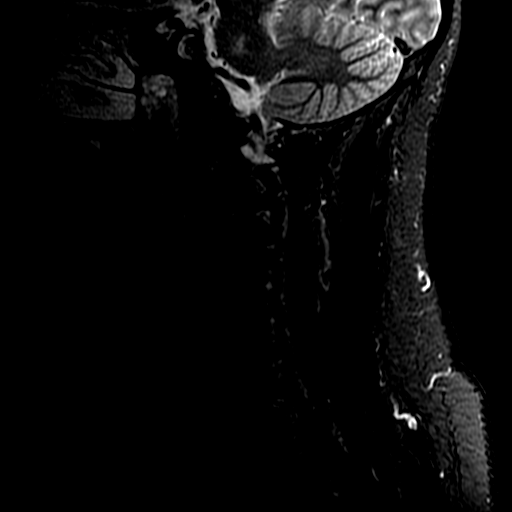

[Series 5: T2 · sagittal · 3.0mm · 0.41mm/px · 7 of 12 slices shown (1 of 2)]
[im 1/12]
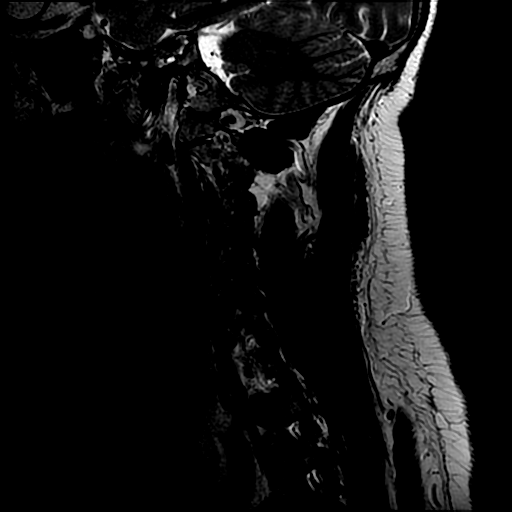
[im 2/12]
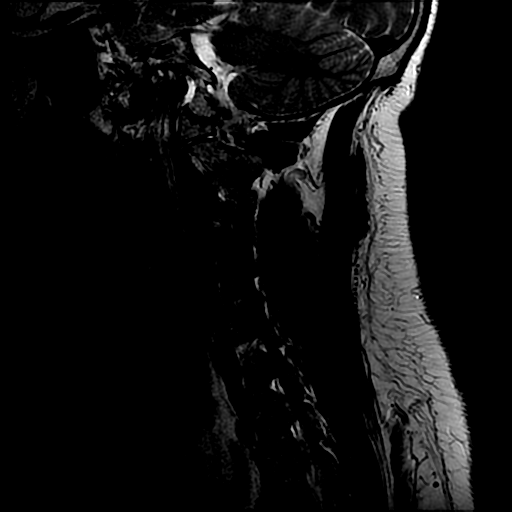
[im 4/12]
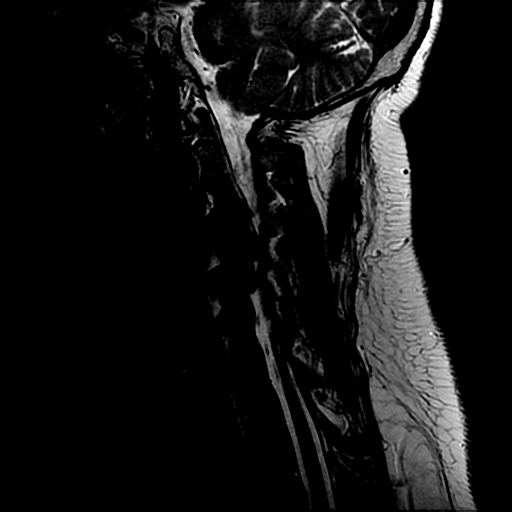
[im 6/12]
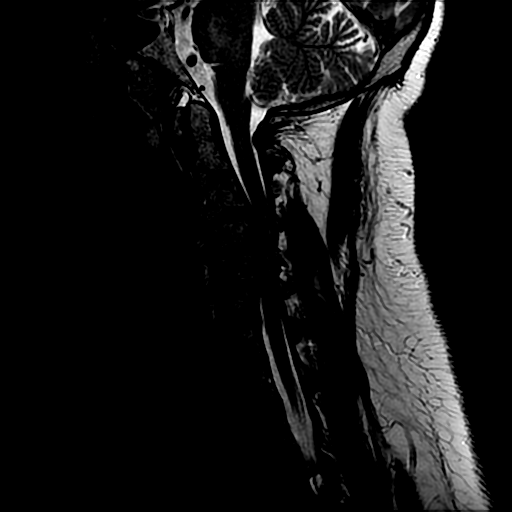
[im 8/12]
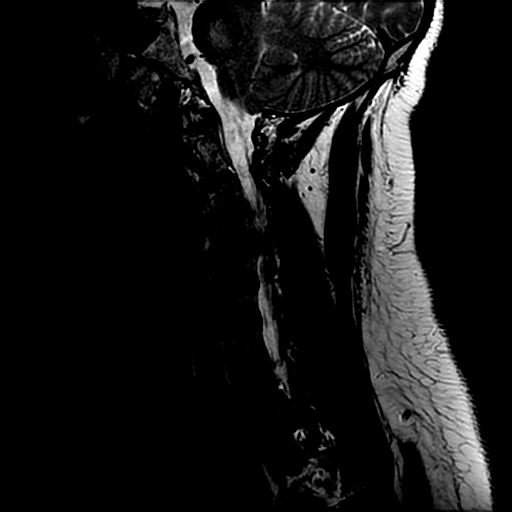
[im 10/12]
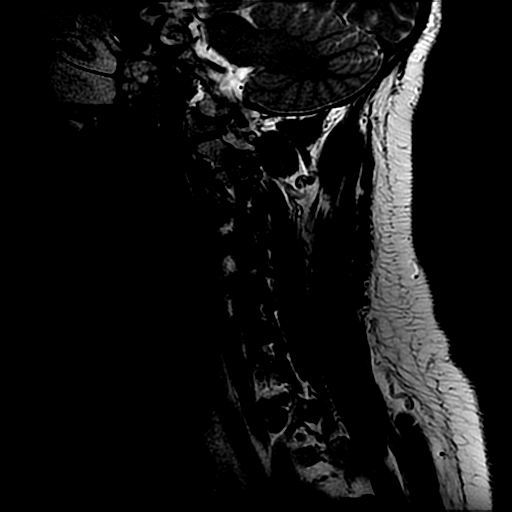
[im 12/12]
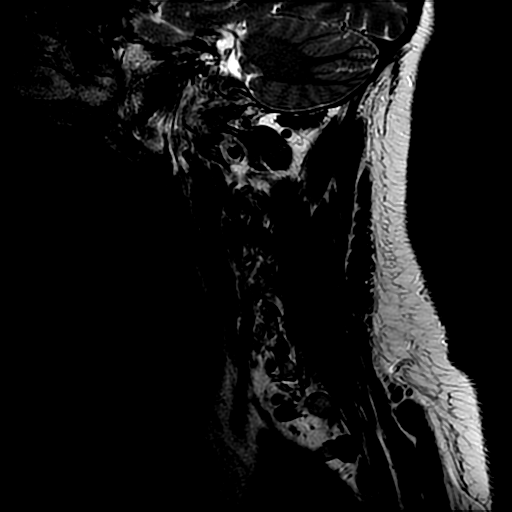

[Series 7: T2 · axial · 3.4mm · 0.35mm/px · z∈[-57,+17]mm · 5 of 25 slices shown (2 of 2)]
[im 1/25]
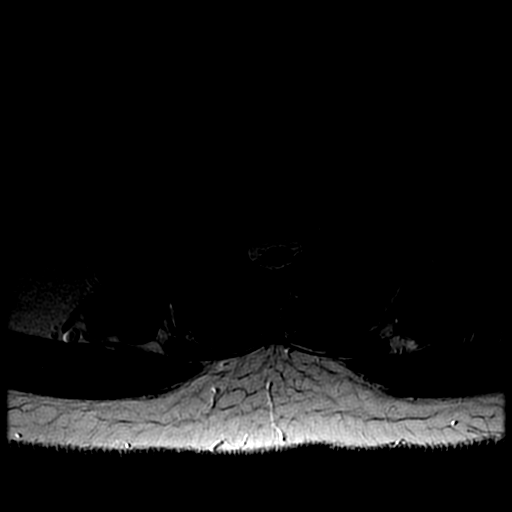
[im 4/25]
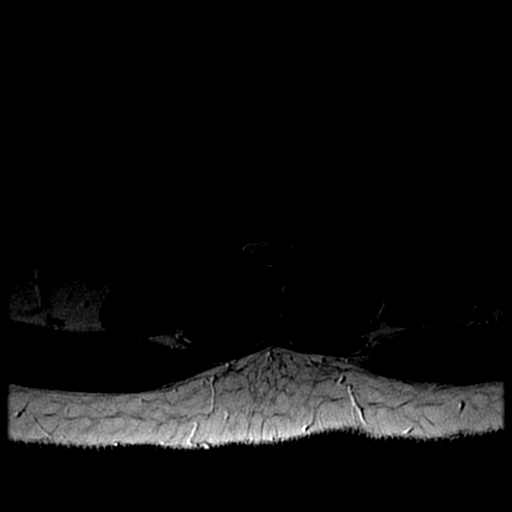
[im 8/25]
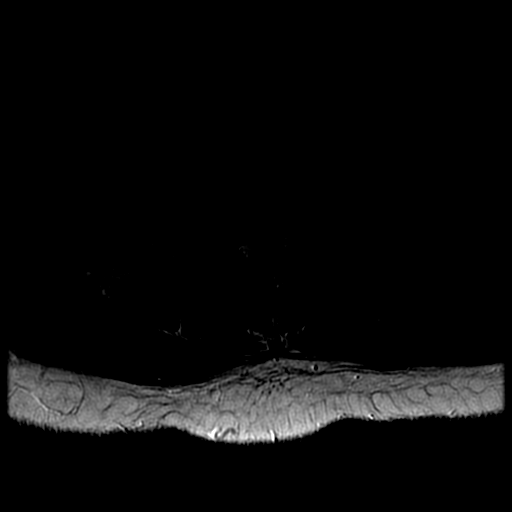
[im 13/25]
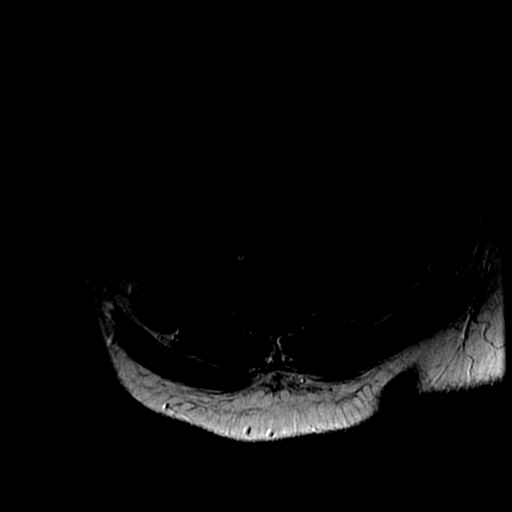
[im 21/25]
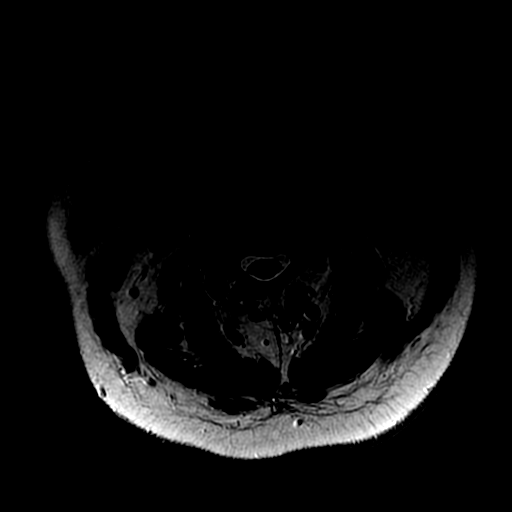

[18 of 48 positions shown; findings below may reference images not displayed]

FINDINGS: Alignment: Physiologic.

Vertebrae: No fracture, evidence of discitis, or bone lesion.

Cord: Mild increased T2 signal within the cervical spinal cord at
the level C4-5 likely reflecting mild cord edema versus
myelomalacia.

Posterior Fossa, vertebral arteries, paraspinal tissues: Negative.

Disc levels:

Discs: Degenerative disc disease with disc height loss at C3-4, C4-5
and C5-6.

C2-3: No significant disc bulge. No neural foraminal stenosis. No
central canal stenosis.

C3-4: Broad-based disc bulge with left uncovertebral degenerative
changes. Mild left foraminal stenosis. No right foraminal stenosis.
No central canal stenosis.

C4-5: Broad-based disc bulge with a left paracentral disc
protrusion. Severe spinal stenosis and compression of the cervical
spinal cord. Left uncovertebral degenerative changes with severe
left foraminal stenosis. Mild right foraminal stenosis.

C5-6: Broad-based disc bulge with a broad central disc protrusion
contacting the ventral cervical spinal cord. Moderate spinal
stenosis. Left uncovertebral degenerative changes with severe left
foraminal stenosis. Moderate right foraminal stenosis.

C6-7: Broad-based disc bulge with a small central disc protrusion.
Moderate bilateral foraminal stenosis.

C7-T1: No significant disc bulge. No neural foraminal stenosis. No
central canal stenosis.
IMPRESSION: 1. At C4-5 there is a broad-based disc bulge with a left paracentral
disc protrusion. Severe spinal stenosis and compression of the
cervical spinal cord. Left uncovertebral degenerative changes with
severe left foraminal stenosis. Mild right foraminal stenosis. Mild
increased T2 signal within the cervical spinal cord at the level
C4-5 likely reflecting mild cord edema versus myelomalacia.
2. At C5-6 there is a broad-based disc bulge with a broad central
disc protrusion contacting the ventral cervical spinal cord.
Moderate spinal stenosis. Left uncovertebral degenerative changes
with severe left foraminal stenosis. Moderate right foraminal
stenosis.
3. At C3-4 there is a broad-based disc bulge with left uncovertebral
degenerative changes. Mild left foraminal stenosis.

## 2018-06-03 ENCOUNTER — Encounter: Payer: Self-pay | Admitting: Neurology

## 2018-06-07 ENCOUNTER — Ambulatory Visit: Payer: Medicaid Other | Admitting: Neurology

## 2018-07-17 IMAGING — RF DG C-ARM 61-120 MIN
1 series · 2 of 2 positions shown · non-contrast
Comparison: Fluoroscopy from 09/19/2016. Cervical spine MRI
11/05/2016

CLINICAL DATA: Cervical spine fusion

EXAM:
DG C-ARM 61-120 MIN; DG CERVICAL SPINE - 1 VIEW

[Series 1: run · 2 of 2 slices shown]
[im 1/2]
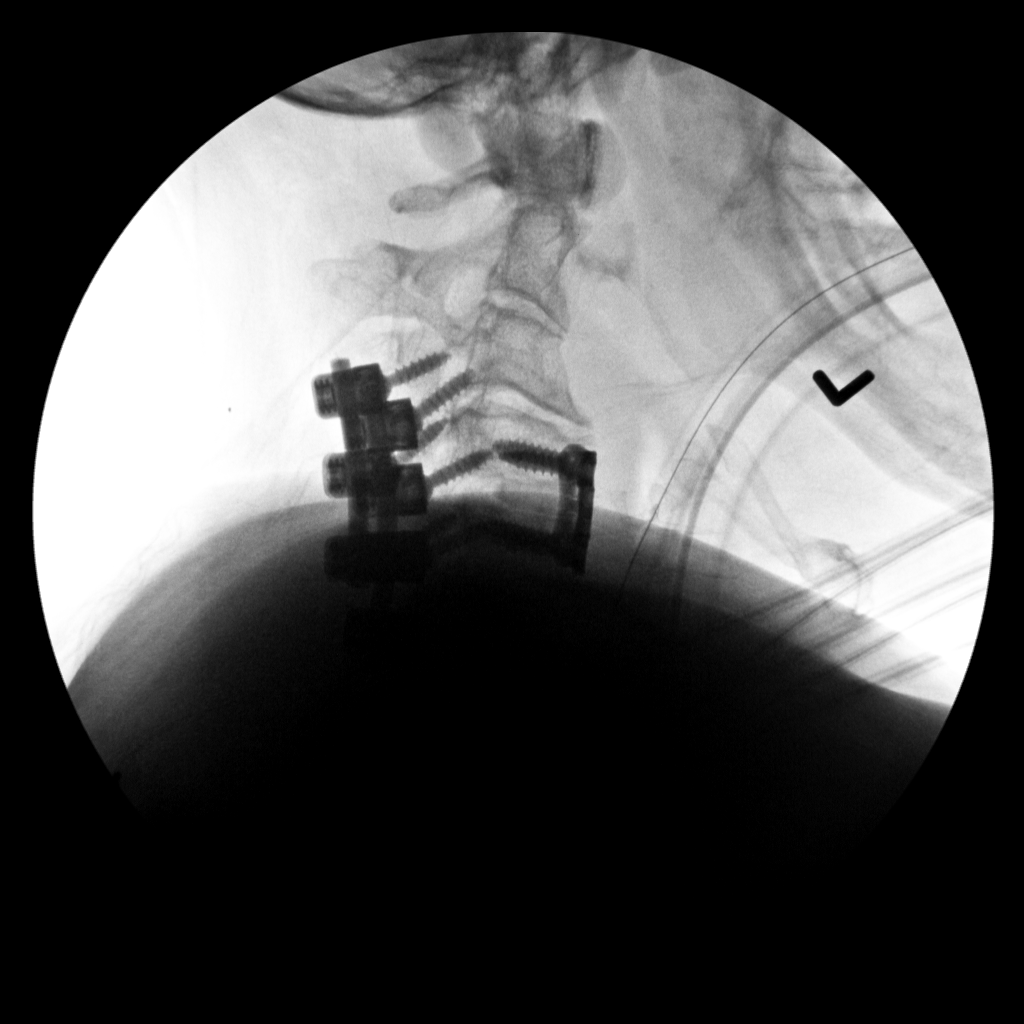
[im 2/2]
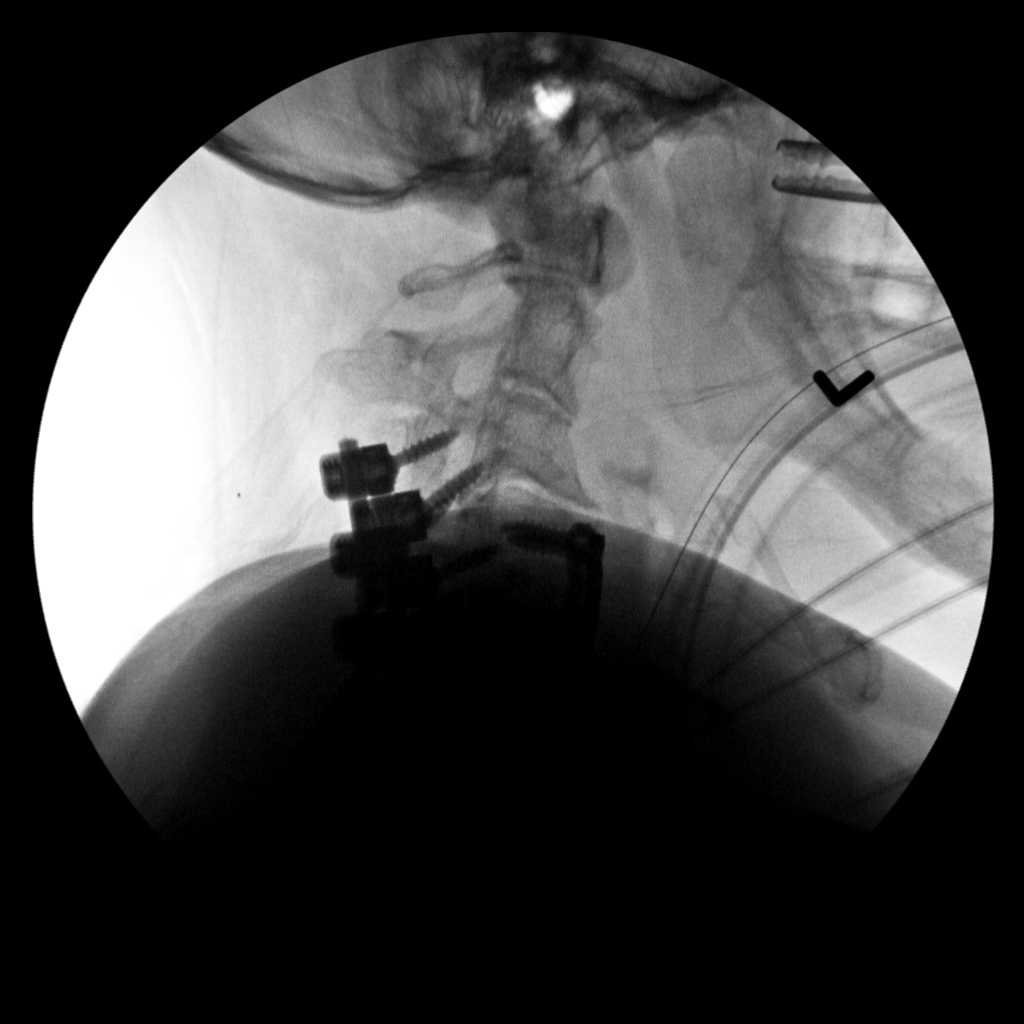

[2 of 2 positions shown; findings below may reference images not displayed]

FINDINGS: Fluoroscopy for posterior decompression, likely C3-C6. Prior C4-5
ACDF. No unexpected finding.
IMPRESSION: Fluoroscopy for posterior decompression and fusion.

## 2018-08-13 ENCOUNTER — Ambulatory Visit: Payer: Self-pay | Admitting: Neurology

## 2018-08-25 ENCOUNTER — Ambulatory Visit: Payer: Self-pay | Admitting: Neurology
